# Patient Record
Sex: Male | Born: 1970 | Race: Black or African American | Hispanic: No | Smoking: Current some day smoker
Health system: Southern US, Community
[De-identification: ages and names within clinical notes are randomized; demographics above are authoritative.]

## PROBLEM LIST (undated history)

## (undated) DIAGNOSIS — G4730 Sleep apnea, unspecified: Secondary | ICD-10-CM

## (undated) DIAGNOSIS — J45909 Unspecified asthma, uncomplicated: Secondary | ICD-10-CM

## (undated) DIAGNOSIS — I10 Essential (primary) hypertension: Secondary | ICD-10-CM

## (undated) DIAGNOSIS — R972 Elevated prostate specific antigen [PSA]: Secondary | ICD-10-CM

## (undated) HISTORY — DX: Sleep apnea, unspecified: G47.30

## (undated) HISTORY — DX: Unspecified asthma, uncomplicated: J45.909

## (undated) HISTORY — DX: Elevated prostate specific antigen (PSA): R97.20

---

## 2004-06-15 ENCOUNTER — Emergency Department: Payer: Self-pay | Admitting: Emergency Medicine

## 2004-06-28 ENCOUNTER — Ambulatory Visit: Payer: Self-pay | Admitting: Physician Assistant

## 2006-08-28 ENCOUNTER — Ambulatory Visit: Payer: Self-pay | Admitting: Family Medicine

## 2007-02-14 ENCOUNTER — Ambulatory Visit: Payer: Self-pay | Admitting: Internal Medicine

## 2007-05-01 ENCOUNTER — Ambulatory Visit: Payer: Self-pay | Admitting: Radiation Oncology

## 2007-05-22 ENCOUNTER — Ambulatory Visit: Payer: Self-pay | Admitting: Radiation Oncology

## 2007-06-01 ENCOUNTER — Ambulatory Visit: Payer: Self-pay | Admitting: Radiation Oncology

## 2007-08-01 HISTORY — PX: PROSTATE BIOPSY: SHX241

## 2007-08-01 HISTORY — PX: GASTRIC BYPASS: SHX52

## 2007-08-17 ENCOUNTER — Other Ambulatory Visit: Payer: Self-pay

## 2007-08-17 ENCOUNTER — Emergency Department: Payer: Self-pay | Admitting: Emergency Medicine

## 2015-09-03 ENCOUNTER — Encounter: Payer: Self-pay | Admitting: Emergency Medicine

## 2015-09-03 ENCOUNTER — Emergency Department
Admission: EM | Admit: 2015-09-03 | Discharge: 2015-09-04 | Disposition: A | Discharge status: Home / Self Care | Payer: BLUE CROSS/BLUE SHIELD | Attending: Emergency Medicine | Admitting: Emergency Medicine

## 2015-09-03 DIAGNOSIS — R0602 Shortness of breath: Secondary | ICD-10-CM | POA: Diagnosis present

## 2015-09-03 DIAGNOSIS — R0600 Dyspnea, unspecified: Secondary | ICD-10-CM | POA: Diagnosis not present

## 2015-09-03 DIAGNOSIS — F17210 Nicotine dependence, cigarettes, uncomplicated: Secondary | ICD-10-CM | POA: Diagnosis not present

## 2015-09-03 DIAGNOSIS — I10 Essential (primary) hypertension: Secondary | ICD-10-CM | POA: Insufficient documentation

## 2015-09-03 DIAGNOSIS — Z79899 Other long term (current) drug therapy: Secondary | ICD-10-CM | POA: Insufficient documentation

## 2015-09-03 DIAGNOSIS — R600 Localized edema: Secondary | ICD-10-CM | POA: Insufficient documentation

## 2015-09-03 DIAGNOSIS — IMO0001 Reserved for inherently not codable concepts without codable children: Secondary | ICD-10-CM

## 2015-09-03 HISTORY — DX: Essential (primary) hypertension: I10

## 2015-09-03 NOTE — ED Provider Notes (Signed)
[facility] Emergency Department Provider Note  ____________________________________________  Time seen: Approximately 11:59 PM  I have reviewed the triage vital signs and the nursing notes.   HISTORY  Chief Complaint Shortness of Breath    HPI David Rangel is a 45 y.o. male who presents to the ED from home with the chief complaint of shortness of breath. Approximately 10:45 PM, patient was on a conference call for work and smoking a cigarette when he felt like he was having a hard time getting a full deep breath. Symptoms were not associated with chest pain/discomfort, diaphoresis, nausea/vomiting or palpitations. Patient notes this happened one other time several weeks ago, also while smoking a cigarette. Patient does note increased recent stressors both at work and at home. Denies recent fever, chills, cough, congestion, abdominal pain, nausea, vomiting, diarrhea. Denies recent travel or trauma. States he has chronic RLE swelling since knee surgery multiple years ago.    Past Medical History  Diagnosis Date  . Hypertension     There are no active problems to display for this patient.   Past Surgical History  Procedure Laterality Date  . Gastric bypass      Current Outpatient Rx  Name  Route  Sig  Dispense  Refill  . VIAGRA 50 MG tablet   Oral   Take 1 tablet by mouth as needed.      3     Dispense as written.   Marland Kitchen losartan (COZAAR) 50 MG tablet   Oral   Take 50 mg by mouth daily.           Allergies Cashew nut oil  No family history on file.  Social History Social History  Substance Use Topics  . Smoking status: Current Some Day Smoker  . Smokeless tobacco: None  . Alcohol Use: 1.2 oz/week    2 Cans of beer per week    Review of Systems Constitutional: No fever/chills Eyes: No visual changes. ENT: No sore throat. Cardiovascular: Denies chest pain. Respiratory: Positive for shortness of breath. Gastrointestinal: No  abdominal pain.  No nausea, no vomiting.  No diarrhea.  No constipation. Genitourinary: Negative for dysuria. Musculoskeletal: Negative for back pain. Skin: Negative for rash. Neurological: Negative for headaches, focal weakness or numbness.  10-point ROS otherwise negative.  ____________________________________________   PHYSICAL EXAM:  VITAL SIGNS: ED Triage Vitals  Enc Vitals Group     BP 09/03/15 2355 165/90 mmHg     Pulse Rate 09/03/15 2355 77     Resp 09/03/15 2355 24     Temp 09/03/15 2355 98.6 F (37 C)     Temp Source 09/03/15 2355 Oral     SpO2 09/03/15 2355 97 %     Weight 09/03/15 2355 406 lb (184.16 kg)     Height 09/03/15 2355 6\' 2"  (1.88 m)     Head Cir --      Peak Flow --      Pain Score --      Pain Loc --      Pain Edu? --      Excl. in [place]? --     Constitutional: Alert and oriented. Well appearing and in no acute distress. Eyes: Conjunctivae are normal. PERRL. EOMI. Head: Atraumatic. Nose: No congestion/rhinnorhea. Mouth/Throat: Mucous membranes are moist.  Oropharynx non-erythematous. Neck: No stridor.   Cardiovascular: Normal rate, regular rhythm. Grossly normal heart sounds.  Good peripheral circulation. Respiratory: Normal respiratory effort.  No retractions. Lungs slightly diminished bibasilarly; otherwise CTAB. Gastrointestinal: Obese.  Soft and nontender. No distention. No abdominal bruits. No CVA tenderness. Musculoskeletal: No lower extremity tenderness. 2+ nonpitting BLE edema.  No joint effusions. Neurologic:  Normal speech and language. No gross focal neurologic deficits are appreciated. No gait instability. Skin:  Skin is warm, dry and intact. No rash noted. Psychiatric: Mood and affect are normal. Speech and behavior are normal.  ____________________________________________   LABS (all labs ordered are listed, but only abnormal results are displayed)  Labs Reviewed  COMPREHENSIVE METABOLIC PANEL - Abnormal; Notable for the  following:    Calcium 8.3 (*)    All other components within normal limits  CBC WITH DIFFERENTIAL/PLATELET  TROPONIN I  BRAIN NATRIURETIC PEPTIDE   ____________________________________________  EKG  ED ECG REPORT I, SUNG,JADE J, the attending physician, personally viewed and interpreted this ECG.   Date: 09/04/2015  EKG Time: 2352  Rate: 70  Rhythm: normal EKG, normal sinus rhythm  Axis: Normal  Intervals:none  ST&T Change: Nonspecific  ____________________________________________  RADIOLOGY  Chest 2 view (viewed by me; interpreted per Dr. Marisue Humble): No acute pulmonary process. ____________________________________________   PROCEDURES  Procedure(s) performed: None  Critical Care performed: No  ____________________________________________   INITIAL IMPRESSION / ASSESSMENT AND PLAN / ED COURSE  Pertinent labs & imaging results that were available during my care of the patient were reviewed by me and considered in my medical decision making (see chart for details).  45 year old male who presents with shortness of breath while smoking cigarette on a work conference call. Symptoms have resolved by the time of my interview and exam. Labs and CXR unremarkable. Given slightly diminished breath sounds bibasilarly, will try albuterol nebulizer and reassess.  ----------------------------------------- 2:22 AM on 09/04/2015 -----------------------------------------  Patient asleep. Awakened for reexamination. Good aeration, room air saturations 98%. Will prescribe albuterol MDI prn, counseled smoking cessation for greater than 3 minutes, close follow-up with PCP next week. Strict return precautions given. Patient and spouse verbalize understanding and agree with plan of care. ____________________________________________   FINAL CLINICAL IMPRESSION(S) / ED DIAGNOSES  Final diagnoses:  Shortness of breath dyspnea      David Blanch, MD 09/04/15 [phone]

## 2015-09-03 NOTE — ED Notes (Signed)
Patient states that about an hour ago he started feeling like he was having a herd time catching his breath. Denies any chest pain.

## 2015-09-04 ENCOUNTER — Emergency Department: Payer: BLUE CROSS/BLUE SHIELD

## 2015-09-04 LAB — BRAIN NATRIURETIC PEPTIDE: B Natriuretic Peptide: 9 pg/mL (ref 0.0–100.0)

## 2015-09-04 LAB — COMPREHENSIVE METABOLIC PANEL
ALBUMIN: 3.7 g/dL (ref 3.5–5.0)
ALK PHOS: 70 U/L (ref 38–126)
ALT: 18 U/L (ref 17–63)
AST: 19 U/L (ref 15–41)
Anion gap: 5 (ref 5–15)
BILIRUBIN TOTAL: 0.5 mg/dL (ref 0.3–1.2)
BUN: 11 mg/dL (ref 6–20)
CALCIUM: 8.3 mg/dL — AB (ref 8.9–10.3)
CO2: 25 mmol/L (ref 22–32)
CREATININE: 1.13 mg/dL (ref 0.61–1.24)
Chloride: 107 mmol/L (ref 101–111)
GFR calc Af Amer: >60 mL/min (ref >60)
GLUCOSE: 93 mg/dL (ref 65–99)
Potassium: 3.9 mmol/L (ref 3.5–5.1)
Sodium: 137 mmol/L (ref 135–145)
TOTAL PROTEIN: 7.3 g/dL (ref 6.5–8.1)

## 2015-09-04 LAB — CBC WITH DIFFERENTIAL/PLATELET
BASOS ABS: 0.1 10*3/uL (ref 0–0.1)
BASOS PCT: 1 %
Eosinophils Absolute: 0.2 10*3/uL (ref 0–0.7)
Eosinophils Relative: 4 %
HCT: 42.8 % (ref 40.0–52.0)
HEMOGLOBIN: 14.3 g/dL (ref 13.0–18.0)
LYMPHS PCT: 32 %
Lymphs Abs: 2 10*3/uL (ref 1.0–3.6)
MCH: 28.9 pg (ref 26.0–34.0)
MCHC: 33.3 g/dL (ref 32.0–36.0)
MCV: 86.9 fL (ref 80.0–100.0)
MONOS PCT: 14 %
Monocytes Absolute: 0.9 10*3/uL (ref 0.2–1.0)
NEUTROS ABS: 3.1 10*3/uL (ref 1.4–6.5)
NEUTROS PCT: 49 %
Platelets: 253 10*3/uL (ref 150–440)
RBC: 4.93 MIL/uL (ref 4.40–5.90)
RDW: 14.4 % (ref 11.5–14.5)
WBC: 6.3 10*3/uL (ref 3.8–10.6)

## 2015-09-04 LAB — TROPONIN I: Troponin I: <0.03 ng/mL (ref <0.031)

## 2015-09-04 MED ORDER — ALBUTEROL SULFATE (2.5 MG/3ML) 0.083% IN NEBU
2.5000 mg | INHALATION_SOLUTION | Freq: Once | RESPIRATORY_TRACT | Status: AC
  Administered 2015-09-04: 2.5 mg via RESPIRATORY_TRACT
  Filled 2015-09-04: qty 3

## 2015-09-04 MED ORDER — ALBUTEROL SULFATE HFA 108 (90 BASE) MCG/ACT IN AERS: 2.0000 | INHALATION_SPRAY | RESPIRATORY_TRACT | Status: DC | PRN

## 2015-09-04 MED ORDER — ALBUTEROL SULFATE HFA 108 (90 BASE) MCG/ACT IN AERS: 2.0000 | INHALATION_SPRAY | Freq: Once | RESPIRATORY_TRACT | Status: DC

## 2015-09-04 NOTE — Discharge Instructions (Signed)
1. You may use albuterol inhaler 2 puffs every 4 hours as needed for shortness of breath. 2. Return to the ER for worsening symptoms, persistent vomiting, difficulty breathing or other concerns.  Shortness of Breath Shortness of breath means you have trouble breathing. It could also mean that you have a medical problem. You should get immediate medical care for shortness of breath. CAUSES   Not enough oxygen in the air such as with high altitudes or a smoke-filled room.  Certain lung diseases, infections, or problems.  Heart disease or conditions, such as angina or heart failure.  Low red blood cells (anemia).  Poor physical fitness, which can cause shortness of breath when you exercise.  Chest or back injuries or stiffness.  Being overweight.  Smoking.  Anxiety, which can make you feel like you are not getting enough air. DIAGNOSIS  Serious medical problems can often be found during your physical exam. Tests may also be done to determine why you are having shortness of breath. Tests may include:  Chest X-rays.  Lung function tests.  Blood tests.  An electrocardiogram (ECG).  An ambulatory electrocardiogram. An ambulatory ECG records your heartbeat patterns over a 24-hour period.  Exercise testing.  A transthoracic echocardiogram (TTE). During echocardiography, sound waves are used to evaluate how blood flows through your heart.  A transesophageal echocardiogram (TEE).  Imaging scans. Your health care provider may not be able to find a cause for your shortness of breath after your exam. In this case, it is important to have a follow-up exam with your health care provider as directed.  TREATMENT  Treatment for shortness of breath depends on the cause of your symptoms and can vary greatly. HOME CARE INSTRUCTIONS   Do not smoke. Smoking is a common cause of shortness of breath. If you smoke, ask for help to quit.  Avoid being around chemicals or things that may bother  your breathing, such as paint fumes and dust.  Rest as needed. Slowly resume your usual activities.  If medicines were prescribed, take them as directed for the full length of time directed. This includes oxygen and any inhaled medicines.  Keep all follow-up appointments as directed by your health care provider. SEEK MEDICAL CARE IF:   Your condition does not improve in the time expected.  You have a hard time doing your normal activities even with rest.  You have any new symptoms. SEEK IMMEDIATE MEDICAL CARE IF:   Your shortness of breath gets worse.  You feel light-headed, faint, or develop a cough not controlled with medicines.  You start coughing up blood.  You have pain with breathing.  You have chest pain or pain in your arms, shoulders, or abdomen.  You have a fever.  You are unable to walk up stairs or exercise the way you normally do. MAKE SURE YOU:  Understand these instructions.  Will watch your condition.  Will get help right away if you are not doing well or get worse.   This information is not intended to replace advice given to you by your health care provider. Make sure you discuss any questions you have with your health care provider.   Document Released: 04/11/2001 Document Revised: 07/22/2013 Document Reviewed: 10/02/2011 Elsevier Interactive Patient Education [employer].

## 2015-10-05 ENCOUNTER — Encounter: Payer: Self-pay | Admitting: Urology

## 2015-10-05 ENCOUNTER — Ambulatory Visit (INDEPENDENT_AMBULATORY_CARE_PROVIDER_SITE_OTHER): Payer: BLUE CROSS/BLUE SHIELD | Admitting: Urology

## 2015-10-05 VITALS — BP 163/91 | HR 69 | Ht 74.0 in | Wt >= 6400 oz

## 2015-10-05 DIAGNOSIS — R350 Frequency of micturition: Secondary | ICD-10-CM | POA: Diagnosis not present

## 2015-10-05 DIAGNOSIS — R972 Elevated prostate specific antigen [PSA]: Secondary | ICD-10-CM | POA: Diagnosis not present

## 2015-10-05 LAB — BLADDER SCAN AMB NON-IMAGING

## 2015-10-05 LAB — URINALYSIS, COMPLETE
BILIRUBIN UA: NEGATIVE
GLUCOSE, UA: NEGATIVE
KETONES UA: NEGATIVE
Nitrite, UA: NEGATIVE
PH UA: 7.5 (ref 5.0–7.5)
PROTEIN UA: NEGATIVE
RBC UA: NEGATIVE
SPEC GRAV UA: 1.02 (ref 1.005–1.030)

## 2015-10-05 LAB — MICROSCOPIC EXAMINATION: Bacteria, UA: NONE SEEN

## 2015-10-05 MED ORDER — METRONIDAZOLE 500 MG PO TABS: 500.0000 mg | ORAL_TABLET | Freq: Three times a day (TID) | ORAL | Status: DC

## 2015-10-05 MED ORDER — CIPROFLOXACIN HCL 500 MG PO TABS: 500.0000 mg | ORAL_TABLET | Freq: Two times a day (BID) | ORAL | Status: DC

## 2015-10-05 NOTE — Progress Notes (Signed)
10/05/2015 1:26 PM   David Rangel March 25, 1971 [id number]  Referring provider: Cletis Athens, MD [address] [place], [place] [address]  Chief Complaint  Patient presents with  . Elevated PSA    New Patient    HPI: A 46 year old African-American male presents today for further evaluation and management of an elevated PSA and voiding symptoms. The patient has a nebulous history of prostate cancer, diagnosed by Dr. Eliberto Ivory in 2008 or 2009. The patient relates that he had 1/13 cores positive for prostate cancer. He offered him surgical intervention and the patient sought a second opinion at [facility]. The patient was seen by radiation oncology and recommended that he pursue active surveillance. The biopsy information or any consult notes are unavailable for my review. However, the patient has been lost to follow-up. He tells me that his PSA is been between 3.8 and 4.3 for the most part. He recently saw his new primary care provider who checked a PSA and it was noted to be elevated at 4.5 on 09/16/15. We do not have any additional PSAs to compare. The patient has a family history of prostate cancer in that both his father and his mother's brother were diagnosed with prostate cancer at a reasonably early age. In addition to the above, the patient also states that he has recently had an encounter with a male who was then diagnosed with Trichomonas. The patient states that he has been having intermittent yellow discharge per urethra. He also has had some dysuria and urinary frequency. He has been tested for STDs several times and each time has tested negative. He has not been treated for any infection at this time.  Otherwise, the patient states that he has a strong erections without the aid of phosphodiesterase inhibitor medications.  He has minimal voiding symptoms with a IPSS of 3     PMH: Past Medical History  Diagnosis Date  . Hypertension   . Sleep apnea   . Elevated PSA     Surgical  History: Past Surgical History  Procedure Laterality Date  . Gastric bypass  2009  . Prostate biopsy  2009    ?postitive    Home Medications:    Medication List       This list is accurate as of: 10/05/15  1:26 PM.  Always use your most recent med list.               albuterol 108 (90 Base) MCG/ACT inhaler  Commonly known as:  PROVENTIL HFA;VENTOLIN HFA  Inhale 2 puffs into the lungs every 4 (four) hours as needed for wheezing or shortness of breath.     ciprofloxacin 500 MG tablet  Commonly known as:  CIPRO  Take 1 tablet (500 mg total) by mouth every 12 (twelve) hours.     losartan 100 MG tablet  Commonly known as:  COZAAR     metroNIDAZOLE 500 MG tablet  Commonly known as:  FLAGYL  Take 1 tablet (500 mg total) by mouth 3 (three) times daily.     VIAGRA 50 MG tablet  Generic drug:  sildenafil  Take 1 tablet by mouth as needed.        Allergies:  Allergies  Allergen Reactions  . Cashew Nut Oil     Family History: Family History  Problem Relation Age of Onset  . Prostate cancer Paternal Uncle   . Prostate cancer Maternal Uncle   . Bladder Cancer Neg Hx   . Kidney cancer Neg Hx  Social History:  reports that he has quit smoking. He does not have any smokeless tobacco history on file. He reports that he drinks about 1.2 oz of alcohol per week. He reports that he does not use illicit drugs.  ROS: UROLOGY Frequent Urination?: Yes Hard to postpone urination?: Yes Burning/pain with urination?: Yes Get up at night to urinate?: Yes Leakage of urine?: No Urine stream starts and stops?: No Trouble starting stream?: No Do you have to strain to urinate?: No Blood in urine?: Yes Urinary tract infection?: No Sexually transmitted disease?: No Injury to kidneys or bladder?: No Painful intercourse?: No Weak stream?: No Erection problems?: Yes Penile pain?: No  Gastrointestinal Nausea?: No Vomiting?: No Indigestion/heartburn?: No Diarrhea?:  No Constipation?: No  Constitutional Fever: No Night sweats?: No Weight loss?: No Fatigue?: No  Skin Skin rash/lesions?: No Itching?: No  Eyes Blurred vision?: No Double vision?: No  Ears/Nose/Throat Sore throat?: No Sinus problems?: No  Hematologic/Lymphatic Swollen glands?: No Easy bruising?: No  Cardiovascular Leg swelling?: No Chest pain?: No  Respiratory Cough?: No Shortness of breath?: No  Endocrine Excessive thirst?: No  Musculoskeletal Back pain?: No Joint pain?: No  Neurological Headaches?: No Dizziness?: No  Psychologic Depression?: No Anxiety?: No  Physical Exam: BP 163/91 mmHg  Pulse 69  Ht 6\' 2"  (1.88 m)  Wt 404 lb (183.253 kg)  BMI 51.85 kg/m2  Constitutional:  Alert and oriented, No acute distress. HEENT:  AT, moist mucus membranes.  Trachea midline, no masses. Cardiovascular: No clubbing, cyanosis, or edema. Respiratory: Normal respiratory effort, no increased work of breathing. GI: Abdomen is soft, nontender, nondistended, no abdominal masses GU: No CVA tenderness.  Prostate is enlarged, plus 1 in size, but smooth, symmetric, no nodules Skin: No rashes, bruises or suspicious lesions. Lymph: No cervical or inguinal adenopathy. Neurologic: Grossly intact, no focal deficits, moving all 4 extremities. Psychiatric: Normal mood and affect.  Laboratory Data: Lab Results  Component Value Date   WBC 6.3 09/04/2015   HGB 14.3 09/04/2015   HCT 42.8 09/04/2015   MCV 86.9 09/04/2015   PLT 253 09/04/2015    Lab Results  Component Value Date   CREATININE 1.13 09/04/2015    No results found for: PSA  No results found for: TESTOSTERONE  No results found for: HGBA1C  Urinalysis No results found for: COLORURINE, APPEARANCEUR, LABSPEC, PHURINE, GLUCOSEU, HGBUR, BILIRUBINUR, KETONESUR, PROTEINUR, UROBILINOGEN, NITRITE, LEUKOCYTESUR  Pertinent Imaging: None  Assessment & Plan:  The patient has an elevated PSA and a very strange  and poorly documented history of prostate cancer. He would've been 45 years old when he was first diagnosed with prostate cancer. He has been lost to follow-up with no recent PSAs.  In discussing his PSA results I discussed the pitfalls of screening as well as the etiology of elevated PSA. The patient likely had an ejaculation just prior to this most recent PSA being drawn which can skew the results. Further, the patient has voiding symptoms presumably from an untreated sexually transmitted infection.  1. Urinary frequency The plan is to treat the patient with Cipro and Flagyl to cover any urinary tract pathogens. His urine today did not demonstrate Trichomonas, however one of his partners was diagnosed with a recently. - Urinalysis, Complete - BLADDER SCAN AMB NON-IMAGING - PSA  2. Elevated PSA We will repeat the patient's PSA and follow-up with him in 3 months. If it remains elevated at that time, I would strongly recommend the patient consider a repeat biopsy.  The patient  will make sure that he maintains plenty of hydration and stains from ejaculation for 1 week prior to his PSA.   Return in about 3 months (around 01/05/2016).  Ardis Hughs, [facility] [address], [place], [place] [address] [phone]

## 2016-01-03 ENCOUNTER — Other Ambulatory Visit: Payer: BLUE CROSS/BLUE SHIELD

## 2016-01-14 ENCOUNTER — Other Ambulatory Visit: Payer: BLUE CROSS/BLUE SHIELD

## 2016-01-18 ENCOUNTER — Ambulatory Visit: Payer: BLUE CROSS/BLUE SHIELD | Admitting: Urology

## 2016-11-23 DIAGNOSIS — L293 Anogenital pruritus, unspecified: Secondary | ICD-10-CM | POA: Diagnosis not present

## 2016-11-23 DIAGNOSIS — N401 Enlarged prostate with lower urinary tract symptoms: Secondary | ICD-10-CM | POA: Diagnosis not present

## 2016-11-23 DIAGNOSIS — M5440 Lumbago with sciatica, unspecified side: Secondary | ICD-10-CM | POA: Diagnosis not present

## 2016-11-23 DIAGNOSIS — I10 Essential (primary) hypertension: Secondary | ICD-10-CM | POA: Diagnosis not present

## 2017-05-02 DIAGNOSIS — G4733 Obstructive sleep apnea (adult) (pediatric): Secondary | ICD-10-CM | POA: Diagnosis not present

## 2017-05-02 DIAGNOSIS — I10 Essential (primary) hypertension: Secondary | ICD-10-CM | POA: Diagnosis not present

## 2017-05-02 DIAGNOSIS — N401 Enlarged prostate with lower urinary tract symptoms: Secondary | ICD-10-CM | POA: Diagnosis not present

## 2017-05-30 DIAGNOSIS — G4733 Obstructive sleep apnea (adult) (pediatric): Secondary | ICD-10-CM | POA: Diagnosis not present

## 2017-07-02 DIAGNOSIS — Z9989 Dependence on other enabling machines and devices: Secondary | ICD-10-CM | POA: Diagnosis not present

## 2017-07-02 DIAGNOSIS — G4733 Obstructive sleep apnea (adult) (pediatric): Secondary | ICD-10-CM | POA: Diagnosis not present

## 2017-07-02 DIAGNOSIS — L293 Anogenital pruritus, unspecified: Secondary | ICD-10-CM | POA: Diagnosis not present

## 2017-07-31 DIAGNOSIS — C61 Malignant neoplasm of prostate: Secondary | ICD-10-CM

## 2017-07-31 HISTORY — DX: Malignant neoplasm of prostate: C61

## 2017-07-31 HISTORY — PX: PROSTATE BIOPSY: SHX241

## 2017-10-02 DIAGNOSIS — N401 Enlarged prostate with lower urinary tract symptoms: Secondary | ICD-10-CM | POA: Diagnosis not present

## 2017-10-02 DIAGNOSIS — L293 Anogenital pruritus, unspecified: Secondary | ICD-10-CM | POA: Diagnosis not present

## 2017-10-02 DIAGNOSIS — G4733 Obstructive sleep apnea (adult) (pediatric): Secondary | ICD-10-CM | POA: Diagnosis not present

## 2017-12-11 DIAGNOSIS — G4733 Obstructive sleep apnea (adult) (pediatric): Secondary | ICD-10-CM | POA: Diagnosis not present

## 2018-01-11 DIAGNOSIS — G4733 Obstructive sleep apnea (adult) (pediatric): Secondary | ICD-10-CM | POA: Diagnosis not present

## 2018-02-10 DIAGNOSIS — G4733 Obstructive sleep apnea (adult) (pediatric): Secondary | ICD-10-CM | POA: Diagnosis not present

## 2018-02-26 DIAGNOSIS — L293 Anogenital pruritus, unspecified: Secondary | ICD-10-CM | POA: Diagnosis not present

## 2018-02-26 DIAGNOSIS — N401 Enlarged prostate with lower urinary tract symptoms: Secondary | ICD-10-CM | POA: Diagnosis not present

## 2018-02-26 DIAGNOSIS — G4733 Obstructive sleep apnea (adult) (pediatric): Secondary | ICD-10-CM | POA: Diagnosis not present

## 2018-03-06 NOTE — Progress Notes (Signed)
03/08/2018 3:18 PM   Melton Alar 23-Dec-1970 [id number]  Reason for visit: Follow up low risk prostate cancer, previously lost to follow up  HPI: Mr. Koppen is a 47 year old male with history of obesity s/p gastric bypass and low risk prostate cancer diagnosed over 10 years ago. He reportedly had a single core of Gleason 3+3=6 prostate cancer on biopsy ~2008 with Dr. Yves Dill and has been previously lost to follow-up multiple times. He was last seen by Dr. Louis Meckel in our group in March 2017, and PSA was 4.5 at that time. He recommended a repeat PSA and considering prostate biopsy, however the patient did not follow up. He was also treated for a possible STD (exposure to trichomonas) at that time with Cipro and Flagyl.   He is here today from his PCP, as his PSA ~2 months ago was 7.5. He also has a strong family history of PCa in father, uncle, and grandfather. His uncle and grandfather were both treated for their cancer, but he is unsure if surgery or radiation. He denies weight loss, hematuria, or bone pain.   He reports his erectile dysfunction is well managed with tadalafil.  Finally, his chief complaint in Epic prior to this visit was "urethral itching," and an STD panel was sent prior to his arrival in clinic. He denies any urethral burning, pain, discharge, or risky sexual behavior.  ROS: Please see flowsheet from today's date for complete review of systems.  Physical Exam: There were no vitals taken for this visit.  Constitutional:  Alert and oriented, No acute distress, obese Respiratory: Normal respiratory effort, no increased work of breathing. GI: Abdomen is soft, nontender, nondistended, no abdominal masses GU: No CVA tenderness DRE: Small ~30g prostate, exam limited by patient body habitus, no masses, irregularity, or lesions Skin: No rashes, bruises or suspicious lesions. Neurologic: Grossly intact, no focal deficits, moving all 4 extremities. Psychiatric: Normal mood and  affect  Laboratory Data: Lab Results  Component Value Date   WBC 6.3 09/04/2015   HGB 14.3 09/04/2015   HCT 42.8 09/04/2015   MCV 86.9 09/04/2015   PLT 253 09/04/2015    Lab Results  Component Value Date   CREATININE 1.13 09/04/2015    No results found for: PSA  No results found for: TESTOSTERONE  No results found for: HGBA1C  Urinalysis    Component Value Date/Time   APPEARANCEUR Cloudy (A) 10/05/2015 1136   GLUCOSEU Negative 10/05/2015 1136   BILIRUBINUR Negative 10/05/2015 1136   PROTEINUR Negative 10/05/2015 1136   NITRITE Negative 10/05/2015 1136   LEUKOCYTESUR Trace (A) 10/05/2015 1136    Lab Results  Component Value Date   LABMICR See below: 10/05/2015   WBCUA 11-30 (A) 10/05/2015   RBCUA 3-10 (A) 10/05/2015   LABEPIT 0-10 10/05/2015   BACTERIA None seen 10/05/2015    Pertinent Imaging: None to review  Assessment & Plan:   In summary, Mr. Winningham is a 47 year old African-American male with a strong family history of prostate cancer, with low risk prostate cancer in a single core with Gleason score 3+3=6 diagnosed around 2008.  Since that time he has been lost to follow-up numerous times. His most recent PSA with his primary care 2 months prior was 7.5.  We had a long conversation today about his diagnosis of prostate cancer with a strong family history, and the importance of close monitoring on active surveillance.  Unfortunately he has not been biopsied since his original diagnosis in 2008, nor followed closely with PSA values.  With his rising PSA of 7.5, I recommended a repeat PSA and to schedule a prostate biopsy in the coming weeks.  We specifically discussed the risks of bleeding, infection, and blood in the urine semen and stool for up to 4 weeks after prostate biopsy.  The next steps in his management will be pending the results from his prostate biopsy, and I discussed that we would review possible options including active surveillance, surgery, or  radiation when we have those results. All questions answered, and he is in agreement with the plan moving forward.  PLAN: Repeat PSA Schedule prostate biopsy within 2-3 weeks Follow up STD panel results   [name], [place] [address] [place], [place] [address] [phone]

## 2018-03-07 ENCOUNTER — Other Ambulatory Visit: Payer: Self-pay | Admitting: Urology

## 2018-03-07 DIAGNOSIS — Z8546 Personal history of malignant neoplasm of prostate: Secondary | ICD-10-CM

## 2018-03-07 DIAGNOSIS — N342 Other urethritis: Secondary | ICD-10-CM

## 2018-03-08 ENCOUNTER — Other Ambulatory Visit: Payer: Self-pay

## 2018-03-08 ENCOUNTER — Ambulatory Visit (INDEPENDENT_AMBULATORY_CARE_PROVIDER_SITE_OTHER): Payer: BLUE CROSS/BLUE SHIELD | Admitting: Urology

## 2018-03-08 ENCOUNTER — Encounter: Payer: Self-pay | Admitting: Urology

## 2018-03-08 VITALS — BP 152/92 | HR 75 | Ht 74.0 in | Wt >= 6400 oz

## 2018-03-08 DIAGNOSIS — C61 Malignant neoplasm of prostate: Secondary | ICD-10-CM

## 2018-03-08 DIAGNOSIS — Z8546 Personal history of malignant neoplasm of prostate: Secondary | ICD-10-CM | POA: Diagnosis not present

## 2018-03-08 DIAGNOSIS — N342 Other urethritis: Secondary | ICD-10-CM

## 2018-03-08 LAB — URINALYSIS, COMPLETE
BILIRUBIN UA: NEGATIVE
Glucose, UA: NEGATIVE
Ketones, UA: NEGATIVE
Leukocytes, UA: NEGATIVE
Nitrite, UA: NEGATIVE
PH UA: 6.5 (ref 5.0–7.5)
PROTEIN UA: NEGATIVE
RBC UA: NEGATIVE
Specific Gravity, UA: 1.02 (ref 1.005–1.030)
Urobilinogen, Ur: 0.2 mg/dL (ref 0.2–1.0)

## 2018-03-08 LAB — MICROSCOPIC EXAMINATION: RBC, UA: NONE SEEN /hpf (ref 0–2)

## 2018-03-08 NOTE — Addendum Note (Signed)
Addended by: Donalee Citrin on: 03/08/2018 10:55 AM   Modules accepted: Orders

## 2018-03-08 NOTE — Patient Instructions (Signed)
Follow up in 2-3 weeks for prostate bx with [facility]

## 2018-03-09 LAB — PSA: PROSTATE SPECIFIC AG, SERUM: 7.5 ng/mL — AB (ref 0.0–4.0)

## 2018-03-09 LAB — HIV ANTIBODY (ROUTINE TESTING W REFLEX): HIV SCREEN 4TH GENERATION: NONREACTIVE

## 2018-03-10 LAB — CHLAMYDIA/GONOCOCCUS/TRICHOMONAS, NAA
CHLAMYDIA BY NAA: NEGATIVE
GONOCOCCUS BY NAA: NEGATIVE
Trich vag by NAA: NEGATIVE

## 2018-03-21 DIAGNOSIS — G4733 Obstructive sleep apnea (adult) (pediatric): Secondary | ICD-10-CM | POA: Diagnosis not present

## 2018-03-27 ENCOUNTER — Ambulatory Visit: Payer: BLUE CROSS/BLUE SHIELD | Admitting: Urology

## 2018-03-27 ENCOUNTER — Encounter: Payer: Self-pay | Admitting: Urology

## 2018-03-27 ENCOUNTER — Other Ambulatory Visit: Payer: Self-pay | Admitting: Urology

## 2018-03-27 VITALS — BP 147/83 | HR 81 | Ht 74.0 in | Wt >= 6400 oz

## 2018-03-27 DIAGNOSIS — Z8546 Personal history of malignant neoplasm of prostate: Secondary | ICD-10-CM | POA: Diagnosis not present

## 2018-03-27 DIAGNOSIS — R972 Elevated prostate specific antigen [PSA]: Secondary | ICD-10-CM | POA: Diagnosis not present

## 2018-03-27 DIAGNOSIS — C61 Malignant neoplasm of prostate: Secondary | ICD-10-CM | POA: Diagnosis not present

## 2018-03-27 MED ORDER — LEVOFLOXACIN 500 MG PO TABS
500.0000 mg | ORAL_TABLET | Freq: Once | ORAL | Status: AC
  Administered 2018-03-27: 500 mg via ORAL

## 2018-03-27 MED ORDER — GENTAMICIN SULFATE 40 MG/ML IJ SOLN
80.0000 mg | Freq: Once | INTRAMUSCULAR | Status: AC
  Administered 2018-03-27: 80 mg via INTRAMUSCULAR

## 2018-03-27 NOTE — Progress Notes (Signed)
   03/27/18  Indication: Low risk GS 3+3=6 PCa on AS diagnosed 2008, lost to follow up. PSA now 7.5  Prostate Biopsy Procedure   Informed consent was obtained, and we discussed the risks of bleeding and infection/sepsis. A time out was performed to ensure correct patient identity.  Pre-Procedure: - Last PSA Level: 7.5 - Gentamicin and levaquin given for antibiotic prophylaxis - Transrectal Ultrasound performed revealing a 29.5g prostate - No significant hypoechoic or median lobe noted, mild calcifications  Procedure: - Prostate block performed using 10 cc 1% lidocaine and biopsies taken from sextant areas, a total of 12 under ultrasound guidance.  Post-Procedure: - Patient tolerated the procedure well - He was counseled to seek immediate medical attention if experiences significant bleeding, fevers, or severe pain - Return in one week to discuss biopsy results  Assessment/ Plan: Will follow up in 1-2 weeks to discuss pathology  Nickolas Madrid, MD 03/27/2018

## 2018-04-02 ENCOUNTER — Telehealth: Payer: Self-pay

## 2018-04-02 NOTE — Telephone Encounter (Signed)
Pt calls and states that he had a prostate bx approx 1 week ago. Pt states that on Sunday he began feeling ill, this progressed into Monday at which time he began to notice fevers, chills, and aches. Pt notes that his highest tempature was 102. He has also noticed increased urinary urgency. Spoke with Dr. Diamantina Providence who performed bx, he advises that pt go to ED. Advised pt to go to the ED, pt gave verbal understanding.

## 2018-04-03 ENCOUNTER — Encounter (HOSPITAL_BASED_OUTPATIENT_CLINIC_OR_DEPARTMENT_OTHER): Payer: Self-pay | Admitting: Emergency Medicine

## 2018-04-03 ENCOUNTER — Emergency Department (HOSPITAL_BASED_OUTPATIENT_CLINIC_OR_DEPARTMENT_OTHER)
Admission: EM | Admit: 2018-04-03 | Discharge: 2018-04-03 | Disposition: A | Discharge status: Home / Self Care | Payer: BLUE CROSS/BLUE SHIELD | Source: Home / Self Care | Attending: Emergency Medicine | Admitting: Emergency Medicine

## 2018-04-03 ENCOUNTER — Other Ambulatory Visit: Payer: Self-pay | Admitting: Urology

## 2018-04-03 ENCOUNTER — Other Ambulatory Visit: Payer: Self-pay

## 2018-04-03 DIAGNOSIS — R7881 Bacteremia: Secondary | ICD-10-CM | POA: Diagnosis not present

## 2018-04-03 DIAGNOSIS — N419 Inflammatory disease of prostate, unspecified: Secondary | ICD-10-CM | POA: Diagnosis not present

## 2018-04-03 DIAGNOSIS — G4733 Obstructive sleep apnea (adult) (pediatric): Secondary | ICD-10-CM | POA: Diagnosis not present

## 2018-04-03 DIAGNOSIS — Z87891 Personal history of nicotine dependence: Secondary | ICD-10-CM | POA: Diagnosis not present

## 2018-04-03 DIAGNOSIS — N390 Urinary tract infection, site not specified: Secondary | ICD-10-CM | POA: Diagnosis not present

## 2018-04-03 DIAGNOSIS — R509 Fever, unspecified: Secondary | ICD-10-CM | POA: Insufficient documentation

## 2018-04-03 DIAGNOSIS — E669 Obesity, unspecified: Secondary | ICD-10-CM | POA: Diagnosis not present

## 2018-04-03 DIAGNOSIS — Z79899 Other long term (current) drug therapy: Secondary | ICD-10-CM

## 2018-04-03 DIAGNOSIS — B9620 Unspecified Escherichia coli [E. coli] as the cause of diseases classified elsewhere: Secondary | ICD-10-CM | POA: Diagnosis not present

## 2018-04-03 DIAGNOSIS — C61 Malignant neoplasm of prostate: Secondary | ICD-10-CM | POA: Diagnosis not present

## 2018-04-03 DIAGNOSIS — B9689 Other specified bacterial agents as the cause of diseases classified elsewhere: Secondary | ICD-10-CM | POA: Diagnosis not present

## 2018-04-03 DIAGNOSIS — I10 Essential (primary) hypertension: Secondary | ICD-10-CM | POA: Diagnosis not present

## 2018-04-03 DIAGNOSIS — N410 Acute prostatitis: Secondary | ICD-10-CM

## 2018-04-03 DIAGNOSIS — Z91018 Allergy to other foods: Secondary | ICD-10-CM | POA: Diagnosis not present

## 2018-04-03 DIAGNOSIS — Z9884 Bariatric surgery status: Secondary | ICD-10-CM | POA: Diagnosis not present

## 2018-04-03 DIAGNOSIS — Z8546 Personal history of malignant neoplasm of prostate: Secondary | ICD-10-CM | POA: Insufficient documentation

## 2018-04-03 DIAGNOSIS — Z8042 Family history of malignant neoplasm of prostate: Secondary | ICD-10-CM | POA: Diagnosis not present

## 2018-04-03 LAB — PATHOLOGY REPORT

## 2018-04-03 LAB — CBC WITH DIFFERENTIAL/PLATELET
BASOS ABS: 0 10*3/uL (ref 0.0–0.1)
BASOS PCT: 0 %
EOS ABS: 0.1 10*3/uL (ref 0.0–0.7)
EOS PCT: 1 %
HEMATOCRIT: 37.7 % — AB (ref 39.0–52.0)
Hemoglobin: 12.8 g/dL — ABNORMAL LOW (ref 13.0–17.0)
Lymphocytes Relative: 14 %
Lymphs Abs: 1.2 10*3/uL (ref 0.7–4.0)
MCH: 28.6 pg (ref 26.0–34.0)
MCHC: 34 g/dL (ref 30.0–36.0)
MCV: 84.2 fL (ref 78.0–100.0)
MONO ABS: 1.2 10*3/uL — AB (ref 0.1–1.0)
MONOS PCT: 14 %
NEUTROS ABS: 5.9 10*3/uL (ref 1.7–7.7)
Neutrophils Relative %: 71 %
PLATELETS: 260 10*3/uL (ref 150–400)
RBC: 4.48 MIL/uL (ref 4.22–5.81)
RDW: 13.7 % (ref 11.5–15.5)
WBC: 8.3 10*3/uL (ref 4.0–10.5)

## 2018-04-03 LAB — URINALYSIS, MICROSCOPIC (REFLEX)

## 2018-04-03 LAB — COMPREHENSIVE METABOLIC PANEL
ALK PHOS: 52 U/L (ref 38–126)
ALT: 16 U/L (ref 0–44)
ANION GAP: 11 (ref 5–15)
AST: 19 U/L (ref 15–41)
Albumin: 3.4 g/dL — ABNORMAL LOW (ref 3.5–5.0)
BILIRUBIN TOTAL: 0.7 mg/dL (ref 0.3–1.2)
BUN: 14 mg/dL (ref 6–20)
CO2: 26 mmol/L (ref 22–32)
CREATININE: 1.09 mg/dL (ref 0.61–1.24)
Calcium: 8.2 mg/dL — ABNORMAL LOW (ref 8.9–10.3)
Chloride: 101 mmol/L (ref 98–111)
GFR calc non Af Amer: >60 mL/min (ref >60)
Glucose, Bld: 129 mg/dL — ABNORMAL HIGH (ref 70–99)
Potassium: 3.2 mmol/L — ABNORMAL LOW (ref 3.5–5.1)
SODIUM: 138 mmol/L (ref 135–145)
TOTAL PROTEIN: 7.5 g/dL (ref 6.5–8.1)

## 2018-04-03 LAB — URINALYSIS, ROUTINE W REFLEX MICROSCOPIC
Bilirubin Urine: NEGATIVE
Glucose, UA: NEGATIVE mg/dL
Ketones, ur: 15 mg/dL — AB
Leukocytes, UA: NEGATIVE
Nitrite: POSITIVE — AB
Protein, ur: 30 mg/dL — AB
Specific Gravity, Urine: 1.025 (ref 1.005–1.030)
pH: 5.5 (ref 5.0–8.0)

## 2018-04-03 LAB — I-STAT CG4 LACTIC ACID, ED: Lactic Acid, Venous: 0.87 mmol/L (ref 0.5–1.9)

## 2018-04-03 MED ORDER — SULFAMETHOXAZOLE-TRIMETHOPRIM 800-160 MG PO TABS: 1.0000 | ORAL_TABLET | Freq: Two times a day (BID) | ORAL | 0 refills | Status: AC

## 2018-04-03 MED ORDER — ONDANSETRON HCL 4 MG/2ML IJ SOLN
4.0000 mg | Freq: Once | INTRAMUSCULAR | Status: AC
  Administered 2018-04-03: 4 mg via INTRAVENOUS
  Filled 2018-04-03: qty 2

## 2018-04-03 MED ORDER — SODIUM CHLORIDE 0.9 % IV SOLN
1.0000 g | Freq: Once | INTRAVENOUS | Status: AC
  Administered 2018-04-03: 1 g via INTRAVENOUS
  Filled 2018-04-03: qty 10

## 2018-04-03 MED ORDER — LACTATED RINGERS IV BOLUS
1000.0000 mL | Freq: Once | INTRAVENOUS | Status: AC
  Administered 2018-04-03: 1000 mL via INTRAVENOUS

## 2018-04-03 MED FILL — SULFAMETHOXAZOLE-TMP DS TAB: 800-160 | 14 days supply | Qty: 28 | Fill #0

## 2018-04-03 NOTE — ED Notes (Signed)
Patient is resting comfortably, sleeping, snoring. No distress

## 2018-04-03 NOTE — ED Notes (Signed)
States," I am still having dizziness when I turn or move" Denies pain

## 2018-04-03 NOTE — ED Provider Notes (Signed)
[facility] EMERGENCY DEPARTMENT Provider Note   CSN: [id number] Arrival date & time: 04/03/18  3086     History   Chief Complaint Chief Complaint  Patient presents with  . Fever    HPI David Rangel is a 47 y.o. male.  47 year old male with past medical history including prostate cancer, OSA, hypertension, gastric bypass who presents with fever.  1 week ago, the patient had a prostate biopsy.  He was given antibiotics prior to the procedure, no ongoing antibiotics postprocedure.  3 days ago he began not feeling well.  2 days ago he started running fevers.  T-max 103 last night.  He took Tylenol at 630 this morning before coming in.  He reports one episode of dysuria and one episode of urinary incontinence but no urinary symptoms currently.  His urine was dark but it improved after drinking water.  He denies any flank pain.  He has had an occasional left side soreness that comes and goes, not present currently.  No cough or shortness of breath.  No sick contacts. Normal bowel movements, no bloody stools. His pain has been minimal to absent since biopsy.  The history is provided by the patient.  Fever      Past Medical History:  Diagnosis Date  . Elevated PSA   . Hypertension   . Sleep apnea     Patient Active Problem List   Diagnosis Date Noted  . Prostate cancer ([place]) 03/08/2018    Past Surgical History:  Procedure Laterality Date  . GASTRIC BYPASS  2009  . PROSTATE BIOPSY  2009   ?postitive        Home Medications    Prior to Admission medications   Medication Sig Start Date End Date Taking? Authorizing Provider  amLODipine (NORVASC) 10 MG tablet Take 10 mg by mouth daily. 01/08/18  Yes [provider]  losartan-hydrochlorothiazide (HYZAAR) 100-25 MG tablet Take 1 tablet by mouth daily. 01/08/18  Yes [provider]  sulfamethoxazole-trimethoprim (BACTRIM DS,SEPTRA DS) 800-160 MG tablet Take 1 tablet by mouth 2 (two) times daily for 14  days. 04/03/18 04/17/18  Cassundra Mckeever, Wenda Overland, MD  tadalafil (ADCIRCA/CIALIS) 20 MG tablet Take 20 mg by mouth once a week. 01/22/18   [provider]    Family History Family History  Problem Relation Age of Onset  . Prostate cancer Paternal Uncle   . Prostate cancer Maternal Uncle   . Prostate cancer Father   . Prostate cancer Paternal Grandfather   . Bladder Cancer Neg Hx   . Kidney cancer Neg Hx     Social History Social History   Tobacco Use  . Smoking status: Former Research scientist (life sciences)  . Smokeless tobacco: Never Used  . Tobacco comment: quit in 09/2015  Substance Use Topics  . Alcohol use: Yes    Alcohol/week: 2.0 standard drinks    Types: 2 Cans of beer per week  . Drug use: No     Allergies   Cashew nut oil   Review of Systems Review of Systems  Constitutional: Positive for fever.   All other systems reviewed and are negative except that which was mentioned in HPI   Physical Exam Updated Vital Signs BP 123/75   Pulse 73   Temp 97.8 F (36.6 C) (Oral)   Resp (!) 23   SpO2 97%   Physical Exam  Constitutional: He is oriented to person, place, and time. He appears well-developed and well-nourished. No distress.  HENT:  Head: Normocephalic and atraumatic.  Moist mucous membranes  Eyes: Conjunctivae are normal.  Neck: Neck supple.  Cardiovascular: Normal rate, regular rhythm and normal heart sounds.  No murmur heard. Pulmonary/Chest: Effort normal and breath sounds normal.  Abdominal: Soft. Bowel sounds are normal. He exhibits no distension. There is no tenderness.  Musculoskeletal: He exhibits edema (trace BLE).  Neurological: He is alert and oriented to person, place, and time.  Fluent speech  Skin: Skin is warm and dry.  Psychiatric: He has a normal mood and affect. Judgment normal.  Nursing note and vitals reviewed.    ED Treatments / Results  Labs (all labs ordered are listed, but only abnormal results are displayed) Labs Reviewed    COMPREHENSIVE METABOLIC PANEL - Abnormal; Notable for the following components:      Result Value   Potassium 3.2 (*)    Glucose, Bld 129 (*)    Calcium 8.2 (*)    Albumin 3.4 (*)    All other components within normal limits  CBC WITH DIFFERENTIAL/PLATELET - Abnormal; Notable for the following components:   Hemoglobin 12.8 (*)    HCT 37.7 (*)    Monocytes Absolute 1.2 (*)    All other components within normal limits  URINALYSIS, ROUTINE W REFLEX MICROSCOPIC - Abnormal; Notable for the following components:   Hgb urine dipstick TRACE (*)    Ketones, ur 15 (*)    Protein, ur 30 (*)    Nitrite POSITIVE (*)    All other components within normal limits  URINALYSIS, MICROSCOPIC (REFLEX) - Abnormal; Notable for the following components:   Bacteria, UA MANY (*)    All other components within normal limits  CULTURE, BLOOD (ROUTINE X 2)  URINE CULTURE  CULTURE, BLOOD (ROUTINE X 2)  I-STAT CG4 LACTIC ACID, ED    EKG None  Radiology No results found.  Procedures Procedures (including critical care time)  Medications Ordered in ED Medications  lactated ringers bolus 1,000 mL (0 mLs Intravenous Stopped 04/03/18 1019)  ondansetron (ZOFRAN) injection 4 mg (4 mg Intravenous Given 04/03/18 0756)  cefTRIAXone (ROCEPHIN) 1 g in sodium chloride 0.9 % 100 mL IVPB ( Intravenous Stopped 04/03/18 1005)     Initial Impression / Assessment and Plan / ED Course  I have reviewed the triage vital signs and the nursing notes.  Pertinent labs that were available during my care of the patient were reviewed by me and considered in my medical decision making (see chart for details).     Non toxic on exam, normal VS. No abd tenderness. Gave IVF, zofran and obtained labs. DDx includes UTI/pyelo, prostatitis, viral syndrome.  Lactate and CBC normal. CMP reassuring w/ normal creatinine. UA with evidence of infection, culture sent. Gave ceftriaxone. Suspect prostatitis versus UTI from recent  instrumentation.  I contacted [facility] and discussed with urologist on-call.  He agreed with plan to give ceftriaxone in the ED and he recommended Septra 14-day course at discharge.  Patient has follow-up soon in the clinic to discuss his biopsy results.  I have extensively reviewed return precautions with him including worsening fevers, severe abdominal pain, vomiting, or other sudden changes in his symptoms.  He voiced understanding.  Final Clinical Impressions(s) / ED Diagnoses   Final diagnoses:  Fever, unspecified fever cause  Acute prostatitis    ED Discharge Orders         Ordered    sulfamethoxazole-trimethoprim (BACTRIM DS,SEPTRA DS) 800-160 MG tablet  2 times daily     04/03/18 1057  Jalayna Josten, Wenda Overland, MD 04/03/18 1100

## 2018-04-03 NOTE — ED Triage Notes (Signed)
Pt states he had a prostate biopsy on Wednesday a week ago  Pt states he started not feeling well on Sunday  Monday he had a fever of 102  Pt was instructed to go to the ED but pt waited until this morning  Last night his fever was 103  Pt took tylenol at 0630 this morning

## 2018-04-03 NOTE — ED Notes (Signed)
ED Provider at bedside. 

## 2018-04-03 NOTE — ED Notes (Signed)
Given po fluids 

## 2018-04-04 ENCOUNTER — Other Ambulatory Visit: Payer: Self-pay

## 2018-04-04 ENCOUNTER — Inpatient Hospital Stay (HOSPITAL_COMMUNITY)
Admission: EM | Admit: 2018-04-04 | Discharge: 2018-04-05 | DRG: 690 | Disposition: A | Discharge status: Home / Self Care | Payer: BLUE CROSS/BLUE SHIELD | Attending: Internal Medicine | Admitting: Internal Medicine

## 2018-04-04 ENCOUNTER — Telehealth (HOSPITAL_BASED_OUTPATIENT_CLINIC_OR_DEPARTMENT_OTHER): Payer: Self-pay

## 2018-04-04 ENCOUNTER — Encounter (HOSPITAL_COMMUNITY): Payer: Self-pay | Admitting: *Deleted

## 2018-04-04 DIAGNOSIS — B9689 Other specified bacterial agents as the cause of diseases classified elsewhere: Secondary | ICD-10-CM | POA: Diagnosis not present

## 2018-04-04 DIAGNOSIS — R7881 Bacteremia: Secondary | ICD-10-CM | POA: Diagnosis not present

## 2018-04-04 DIAGNOSIS — I10 Essential (primary) hypertension: Secondary | ICD-10-CM | POA: Diagnosis not present

## 2018-04-04 DIAGNOSIS — N419 Inflammatory disease of prostate, unspecified: Secondary | ICD-10-CM | POA: Diagnosis not present

## 2018-04-04 DIAGNOSIS — Z9989 Dependence on other enabling machines and devices: Secondary | ICD-10-CM

## 2018-04-04 DIAGNOSIS — N390 Urinary tract infection, site not specified: Secondary | ICD-10-CM | POA: Diagnosis not present

## 2018-04-04 DIAGNOSIS — Z87891 Personal history of nicotine dependence: Secondary | ICD-10-CM | POA: Diagnosis not present

## 2018-04-04 DIAGNOSIS — Z79899 Other long term (current) drug therapy: Secondary | ICD-10-CM

## 2018-04-04 DIAGNOSIS — B9620 Unspecified Escherichia coli [E. coli] as the cause of diseases classified elsewhere: Secondary | ICD-10-CM | POA: Diagnosis not present

## 2018-04-04 DIAGNOSIS — Z91018 Allergy to other foods: Secondary | ICD-10-CM | POA: Diagnosis not present

## 2018-04-04 DIAGNOSIS — Z9884 Bariatric surgery status: Secondary | ICD-10-CM

## 2018-04-04 DIAGNOSIS — C61 Malignant neoplasm of prostate: Secondary | ICD-10-CM | POA: Diagnosis present

## 2018-04-04 DIAGNOSIS — Z8042 Family history of malignant neoplasm of prostate: Secondary | ICD-10-CM | POA: Diagnosis not present

## 2018-04-04 DIAGNOSIS — G4733 Obstructive sleep apnea (adult) (pediatric): Secondary | ICD-10-CM | POA: Diagnosis present

## 2018-04-04 DIAGNOSIS — Z6841 Body Mass Index (BMI) 40.0 and over, adult: Secondary | ICD-10-CM

## 2018-04-04 DIAGNOSIS — E669 Obesity, unspecified: Secondary | ICD-10-CM | POA: Diagnosis present

## 2018-04-04 DIAGNOSIS — N410 Acute prostatitis: Secondary | ICD-10-CM

## 2018-04-04 DIAGNOSIS — Z8546 Personal history of malignant neoplasm of prostate: Secondary | ICD-10-CM | POA: Diagnosis not present

## 2018-04-04 LAB — CBC WITH DIFFERENTIAL/PLATELET
Basophils Absolute: 0 10*3/uL (ref 0.0–0.1)
Basophils Relative: 0 %
EOS ABS: 0.2 10*3/uL (ref 0.0–0.7)
Eosinophils Relative: 3 %
HEMATOCRIT: 40.3 % (ref 39.0–52.0)
HEMOGLOBIN: 13.7 g/dL (ref 13.0–17.0)
LYMPHS ABS: 1.8 10*3/uL (ref 0.7–4.0)
LYMPHS PCT: 27 %
MCH: 28.4 pg (ref 26.0–34.0)
MCHC: 34 g/dL (ref 30.0–36.0)
MCV: 83.6 fL (ref 78.0–100.0)
Monocytes Absolute: 1.3 10*3/uL — ABNORMAL HIGH (ref 0.1–1.0)
Monocytes Relative: 20 %
NEUTROS PCT: 50 %
Neutro Abs: 3.3 10*3/uL (ref 1.7–7.7)
Platelets: 324 10*3/uL (ref 150–400)
RBC: 4.82 MIL/uL (ref 4.22–5.81)
RDW: 13.6 % (ref 11.5–15.5)
WBC: 6.7 10*3/uL (ref 4.0–10.5)

## 2018-04-04 LAB — BASIC METABOLIC PANEL
Anion gap: 11 (ref 5–15)
BUN: 10 mg/dL (ref 6–20)
CALCIUM: 8.6 mg/dL — AB (ref 8.9–10.3)
CHLORIDE: 100 mmol/L (ref 98–111)
CO2: 26 mmol/L (ref 22–32)
CREATININE: 1.03 mg/dL (ref 0.61–1.24)
GFR calc Af Amer: >60 mL/min (ref >60)
GFR calc non Af Amer: >60 mL/min (ref >60)
GLUCOSE: 96 mg/dL (ref 70–99)
Potassium: 3.6 mmol/L (ref 3.5–5.1)
Sodium: 137 mmol/L (ref 135–145)

## 2018-04-04 LAB — BLOOD CULTURE ID PANEL (REFLEXED)
Acinetobacter baumannii: NOT DETECTED
CANDIDA KRUSEI: NOT DETECTED
CANDIDA TROPICALIS: NOT DETECTED
CARBAPENEM RESISTANCE: NOT DETECTED
Candida albicans: NOT DETECTED
Candida glabrata: NOT DETECTED
Candida parapsilosis: NOT DETECTED
Enterobacter cloacae complex: NOT DETECTED
Enterobacteriaceae species: DETECTED — AB
Enterococcus species: NOT DETECTED
Escherichia coli: NOT DETECTED
Haemophilus influenzae: NOT DETECTED
KLEBSIELLA OXYTOCA: NOT DETECTED
Klebsiella pneumoniae: NOT DETECTED
Listeria monocytogenes: NOT DETECTED
Neisseria meningitidis: NOT DETECTED
PROTEUS SPECIES: NOT DETECTED
Pseudomonas aeruginosa: NOT DETECTED
SERRATIA MARCESCENS: NOT DETECTED
STAPHYLOCOCCUS AUREUS BCID: NOT DETECTED
STAPHYLOCOCCUS SPECIES: NOT DETECTED
STREPTOCOCCUS PYOGENES: NOT DETECTED
Streptococcus agalactiae: NOT DETECTED
Streptococcus pneumoniae: NOT DETECTED
Streptococcus species: NOT DETECTED

## 2018-04-04 LAB — I-STAT CG4 LACTIC ACID, ED: Lactic Acid, Venous: 0.87 mmol/L (ref 0.5–1.9)

## 2018-04-04 MED ORDER — SODIUM CHLORIDE 0.9 % IV SOLN
1.0000 g | Freq: Once | INTRAVENOUS | Status: AC
  Administered 2018-04-04: 1 g via INTRAVENOUS
  Filled 2018-04-04: qty 10

## 2018-04-04 MED ORDER — ACETAMINOPHEN 650 MG RE SUPP: 650.0000 mg | Freq: Four times a day (QID) | RECTAL | Status: DC | PRN

## 2018-04-04 MED ORDER — ONDANSETRON HCL 4 MG PO TABS: 4.0000 mg | ORAL_TABLET | Freq: Four times a day (QID) | ORAL | Status: DC | PRN

## 2018-04-04 MED ORDER — LOSARTAN POTASSIUM-HCTZ 100-25 MG PO TABS: 1.0000 | ORAL_TABLET | Freq: Every day | ORAL | Status: DC

## 2018-04-04 MED ORDER — LOSARTAN POTASSIUM 50 MG PO TABS
100.0000 mg | ORAL_TABLET | Freq: Every day | ORAL | Status: DC
  Administered 2018-04-05: 100 mg via ORAL
  Filled 2018-04-04: qty 2

## 2018-04-04 MED ORDER — SODIUM CHLORIDE 0.9 % IV SOLN: 1.0000 g | Freq: Once | INTRAVENOUS | Status: DC

## 2018-04-04 MED ORDER — ONDANSETRON HCL 4 MG/2ML IJ SOLN: 4.0000 mg | Freq: Four times a day (QID) | INTRAMUSCULAR | Status: DC | PRN

## 2018-04-04 MED ORDER — ENOXAPARIN SODIUM 100 MG/ML ~~LOC~~ SOLN
90.0000 mg | SUBCUTANEOUS | Status: DC
  Administered 2018-04-05: 90 mg via SUBCUTANEOUS
  Filled 2018-04-04 (×2): qty 1

## 2018-04-04 MED ORDER — AMLODIPINE BESYLATE 10 MG PO TABS
10.0000 mg | ORAL_TABLET | Freq: Every day | ORAL | Status: DC
  Administered 2018-04-05: 10 mg via ORAL
  Filled 2018-04-04: qty 1

## 2018-04-04 MED ORDER — ACETAMINOPHEN 325 MG PO TABS: 650.0000 mg | ORAL_TABLET | Freq: Four times a day (QID) | ORAL | Status: DC | PRN

## 2018-04-04 MED ORDER — SODIUM CHLORIDE 0.9 % IV SOLN
2.0000 g | INTRAVENOUS | Status: DC
  Administered 2018-04-05: 2 g via INTRAVENOUS
  Filled 2018-04-04: qty 2

## 2018-04-04 MED ORDER — HYDROCHLOROTHIAZIDE 25 MG PO TABS
25.0000 mg | ORAL_TABLET | Freq: Every day | ORAL | Status: DC
  Administered 2018-04-05: 25 mg via ORAL
  Filled 2018-04-04: qty 1

## 2018-04-04 NOTE — ED Notes (Signed)
ED TO INPATIENT HANDOFF REPORT  Name/Age/Gender David Rangel 47 y.o. male  Code Status   Home/SNF/Other Home  Chief Complaint PCP sent for abnormal labs  Level of Care/Admitting Diagnosis ED Disposition    ED Disposition Condition [facility] Area: [place] [[id number]]  Level of Care: Med-Surg [16]  Diagnosis: Bacteremia [790.7.ICD-9-CM]  Admitting Physician: Bethena Roys [[id number]]  Attending Physician: Bethena Roys [phone]  Estimated length of stay: past midnight tomorrow  Certification:: I certify this patient will need inpatient services for at least 2 midnights  PT Class (Do Not Modify): Inpatient [101]  PT Acc Code (Do Not Modify): Private [1]       Medical History Past Medical History:  Diagnosis Date  . Elevated PSA   . Hypertension   . Sleep apnea     Allergies Allergies  Allergen Reactions  . Cashew Nut Oil Anaphylaxis    IV Location/Drains/Wounds Patient Lines/Drains/Airways Status   Active Line/Drains/Airways    Name:   Placement date:   Placement time:   Site:   Days:   Peripheral IV 04/04/18 Right Hand   04/04/18    1800    Hand   less than 1          Labs/Imaging Results for orders placed or performed during the hospital encounter of 04/04/18 (from the past 48 hour(s))  CBC with Differential     Status: Abnormal   Collection Time: 04/04/18  4:34 PM  Result Value Ref Range   WBC 6.7 4.0 - 10.5 K/uL   RBC 4.82 4.22 - 5.81 MIL/uL   Hemoglobin 13.7 13.0 - 17.0 g/dL   HCT 40.3 39.0 - 52.0 %   MCV 83.6 78.0 - 100.0 fL   MCH 28.4 26.0 - 34.0 pg   MCHC 34.0 30.0 - 36.0 g/dL   RDW 13.6 11.5 - 15.5 %   Platelets 324 150 - 400 K/uL   Neutrophils Relative % 50 %   Neutro Abs 3.3 1.7 - 7.7 K/uL   Lymphocytes Relative 27 %   Lymphs Abs 1.8 0.7 - 4.0 K/uL   Monocytes Relative 20 %   Monocytes Absolute 1.3 (H) 0.1 - 1.0 K/uL   Eosinophils Relative 3 %   Eosinophils Absolute 0.2 0.0 - 0.7 K/uL    Basophils Relative 0 %   Basophils Absolute 0.0 0.0 - 0.1 K/uL    Comment: Performed at [facility], [place] [address], [place], [place] [address]  Basic metabolic panel     Status: Abnormal   Collection Time: 04/04/18  4:34 PM  Result Value Ref Range   Sodium 137 135 - 145 mmol/L   Potassium 3.6 3.5 - 5.1 mmol/L   Chloride 100 98 - 111 mmol/L   CO2 26 22 - 32 mmol/L   Glucose, Bld 96 70 - 99 mg/dL   BUN 10 6 - 20 mg/dL   Creatinine, Ser 1.03 0.61 - 1.24 mg/dL   Calcium 8.6 (L) 8.9 - 10.3 mg/dL   GFR calc non Af Amer >60 >60 mL/min   GFR calc Af Amer >60 >60 mL/min    Comment: (NOTE) The eGFR has been calculated using the CKD EPI equation. This calculation has not been validated in all clinical situations. eGFR's persistently <60 mL/min signify possible Chronic Kidney Disease.    Anion gap 11 5 - 15    Comment: Performed at [facility], [place] [address], [place], [place] [address]  I-Stat [place]  Lactic Acid, ED     Status: None   Collection Time: 04/04/18  4:38 PM  Result Value Ref Range   Lactic Acid, Venous 0.87 0.5 - 1.9 mmol/L   No results found.  Pending Labs Unresulted Labs (From admission, onward)    Start     Ordered   04/04/18 1619  Blood culture (routine x 2)  BLOOD CULTURE X 2,   STAT     04/04/18 1618          Vitals/Pain Today's Vitals   04/04/18 1305 04/04/18 1306 04/04/18 1700 04/04/18 1800  BP:  (!) 153/89 111/65 124/75  Pulse:  74 70 69  Resp:  '16 16 17  ' Temp:  98.5 F (36.9 C)    TempSrc:  Oral    SpO2:  98% 100% 100%  PainSc: 0-No pain   0-No pain    Isolation Precautions No active isolations  Medications Medications  cefTRIAXone (ROCEPHIN) 1 g in sodium chloride 0.9 % 100 mL IVPB (has no administration in time range)  cefTRIAXone (ROCEPHIN) 1 g in sodium chloride 0.9 % 100 mL IVPB (1 g Intravenous New Bag/Given 04/04/18 1800)    Mobility walks

## 2018-04-04 NOTE — H&P (Signed)
History and Physical    David Rangel EVO:[id number] DOB: January 13, 1971 DOA: 04/04/2018  PCP: Cletis Athens, MD   Patient coming from: Home  Chief Complaint: fevers  HPI: David Rangel is a 47 y.o. male with medical history significant for prostate cancer, HTN, who presented with reports of fevers over the past 3 days.  Patient had prostate biopsy a week ago, was given antibiotics prior to procedure, but not after.  Patient reported an episode of dysuria and urinary incontinence.  Otherwise he has no pressure or pain in his groin region. With onset of fevers patient presented to [facility], prostatitis versus UTI from recent instrumentation was suspected, EDP talked to urologist who recommended 1 dose of Rocephin in the ED and discharged home with 2-week course of Bactrim.  Blood Cultures were obtained, with ID panel positive for Enterobacteriaceae.  Patient was called to come back to the hospital.  ED Course: Stable vitals.  WBC 6.7.  Lactic acid 0.87.  Blood cultures redrawn.  Patient given 1 g IV ceftriaxone.  Hospitalist was consulted for admission, who recommended ID consult if patient needed admission, ID on call Dr. Johnnye Sima recommended admission and that patient needs at least 3-5 days of IV antibiotics.   Review of Systems: As per HPI all other systems reviewed and negative.  Past Medical History:  Diagnosis Date  . Elevated PSA   . Hypertension   . Sleep apnea     Past Surgical History:  Procedure Laterality Date  . GASTRIC BYPASS  2009  . PROSTATE BIOPSY  2009   ?postitive     reports that he has quit smoking. He has never used smokeless tobacco. He reports that he drinks about 2.0 standard drinks of alcohol per week. He reports that he does not use drugs.  Allergies  Allergen Reactions  . Cashew Nut Oil Anaphylaxis    Family History  Problem Relation Age of Onset  . Prostate cancer Paternal Uncle   . Prostate cancer Maternal Uncle   . Prostate cancer Father   . Prostate  cancer Paternal Grandfather   . Bladder Cancer Neg Hx   . Kidney cancer Neg Hx     Prior to Admission medications   Medication Sig Start Date End Date Taking? Authorizing Provider  acetaminophen (TYLENOL) 500 MG tablet Take 500 mg by mouth every 6 (six) hours as needed for mild pain.   Yes [provider]  amLODipine (NORVASC) 10 MG tablet Take 10 mg by mouth daily. 01/08/18  Yes [provider]  losartan-hydrochlorothiazide (HYZAAR) 100-25 MG tablet Take 1 tablet by mouth daily. 01/08/18  Yes [provider]  pseudoephedrine-ibuprofen (CHILDREN'S MOTRIN COLD) 15-100 MG/5ML suspension Take 10 mLs by mouth 4 (four) times daily as needed (fever).   Yes [provider]  sulfamethoxazole-trimethoprim (BACTRIM DS,SEPTRA DS) 800-160 MG tablet Take 1 tablet by mouth 2 (two) times daily for 14 days. 04/03/18 04/17/18 Yes Little, Wenda Overland, MD  tadalafil (ADCIRCA/CIALIS) 20 MG tablet Take 20 mg by mouth once a week. 01/22/18  Yes [provider]    Physical Exam: Vitals:   04/04/18 1306 04/04/18 1700 04/04/18 1800  BP: (!) 153/89 111/65 124/75  Pulse: 74 70 69  Resp: 16 16 17   Temp: 98.5 F (36.9 C)    TempSrc: Oral    SpO2: 98% 100% 100%    Constitutional: Obese, calm, comfortable Vitals:   04/04/18 1306 04/04/18 1700 04/04/18 1800  BP: (!) 153/89 111/65 124/75  Pulse: 74 70 69  Resp: 16  16 17  Temp: 98.5 F (36.9 C)    TempSrc: Oral    SpO2: 98% 100% 100%   Eyes: PERRL, lids and conjunctivae normal ENMT: Mucous membranes are moist. Posterior pharynx clear of any exudate or lesions.  Neck: normal, supple, no masses, no thyromegaly Respiratory: clear to auscultation bilaterally, no wheezing, no crackles. Normal respiratory effort. No accessory muscle use.  Cardiovascular: Regular rate and rhythm, no murmurs / rubs / gallops. No extremity edema. 2+ pedal pulses.  Abdomen: no tenderness, no masses palpated. No hepatosplenomegaly. Bowel sounds  positive.  Musculoskeletal: no clubbing / cyanosis. No joint deformity upper and lower extremities. Good ROM, no contractures. Normal muscle tone.  Skin: no rashes, lesions, ulcers. No induration Neurologic: CN 2-12 grossly intact. Strength 5/5 in all 4.  Psychiatric: Normal judgment and insight. Alert and oriented x 3. Normal mood.   Labs on Admission: I have personally reviewed following labs and imaging studies  CBC: Recent Labs  Lab 04/03/18 0743 04/04/18 1634  WBC 8.3 6.7  NEUTROABS 5.9 3.3  HGB 12.8* 13.7  HCT 37.7* 40.3  MCV 84.2 83.6  PLT 260 220   Basic Metabolic Panel: Recent Labs  Lab 04/03/18 0743 04/04/18 1634  NA 138 137  K 3.2* 3.6  CL 101 100  CO2 26 26  GLUCOSE 129* 96  BUN 14 10  CREATININE 1.09 1.03  CALCIUM 8.2* 8.6*   Liver Function Tests: Recent Labs  Lab 04/03/18 0743  AST 19  ALT 16  ALKPHOS 52  BILITOT 0.7  PROT 7.5  ALBUMIN 3.4*   Urine analysis:    Component Value Date/Time   COLORURINE YELLOW 04/03/2018 0753   APPEARANCEUR CLEAR 04/03/2018 0753   APPEARANCEUR Clear 03/08/2018 1100   LABSPEC 1.025 04/03/2018 0753   PHURINE 5.5 04/03/2018 0753   GLUCOSEU NEGATIVE 04/03/2018 0753   HGBUR TRACE (A) 04/03/2018 0753   BILIRUBINUR NEGATIVE 04/03/2018 0753   BILIRUBINUR Negative 03/08/2018 1100   KETONESUR 15 (A) 04/03/2018 0753   PROTEINUR 30 (A) 04/03/2018 0753   NITRITE POSITIVE (A) 04/03/2018 0753   LEUKOCYTESUR NEGATIVE 04/03/2018 0753   LEUKOCYTESUR Negative 03/08/2018 1100    Radiological Exams on Admission: No results found.  EKG: Independently reviewed.   Assessment/Plan Active Problems:   Bacteremia  Bacteremia-likely E. Coli, from recent prostate biopsy 1 wk ago.  Fevers.  Stable vitals.  WBC 6.7.  Lactic acid 0.87.  Does not meet sepsis criteria. ID panel from blood cultures 04/03/18-Enterobacteriaceae, with urine cultures preliminary results growing E. Coli.  1 g ceftriaxone given in ED. ID consulted in ED  recommended 3 to 5 days of IV antibiotics. -Continue ceftriaxone at increased dose 2 g daily  -Follow-up initial 9/4 and repeat blood culture 9/5 results and sensitivities  UTI/acute prostatitis from recent instrumentation for prostate biopsy.  -Continue IV ceftriaxone -Follow-up final urine cultures  Prostrate cancer-recent prostate biopsy follows with [facility]. -Patient has an appointment next week.  Hypertension-stable -Continue home antihypertensives losartan 100 HCTZ 25, Norvasc 10  DVT prophylaxis: Lovenox Code Status: Full Family Communication: Significant order at bedside Disposition Plan: Per rounding team Consults called: None  Admission status: Inpt, med-surg   Bethena Roys MD [facility] Pager [phone] From 6PM-2AM.  Otherwise please contact night-coverage [email or web address] Password [facility]  04/04/2018, 6:31 PM

## 2018-04-04 NOTE — ED Triage Notes (Signed)
Pt was called today and informed he had positive blood cultures from blood drawn yesterday. Pt was seen yesterday and diagnosed with acute prostatitis. Pt took bactrim this morning.

## 2018-04-04 NOTE — ED Notes (Signed)
Report called to Andee Poles, RN on 5E. Transporter called to transport patient to 1518.

## 2018-04-04 NOTE — ED Provider Notes (Signed)
[facility] DEPT Provider Note   CSN: [id number] Arrival date & time: 04/04/18  1251     History   Chief Complaint Chief Complaint  Patient presents with  . Abnormal Lab    HPI David Rangel is a 47 y.o. male with a past medical history of prostate cancer, hypertension, who presents to ED after being told he had positive blood cultures drawn yesterday.  Patient was seen and evaluated at [facility] ED yesterday for fever with T-max 103.  He has felt feverish for the past 3 days prior to evaluation yesterday.  He had a prostate biopsy done 1 week ago.  He was given antibiotics prior to the procedure but was not continued postprocedure.  He had one episode of dysuria and one episode of urinary incontinence.  Provider yesterday spoke to his urologist who recommended 1 dose of Rocephin in the ED and discharging home with 2-week course of Bactrim.  Patient reports compliance with his Bactrim last night and today.  He reports overall feeling better than when he presented yesterday.  He denies any changes to bowel movements, urination, ongoing fevers, vomiting, hematuria.  Follow-up appointment with urologist is in 6 days.  HPI  Past Medical History:  Diagnosis Date  . Elevated PSA   . Hypertension   . Sleep apnea     Patient Active Problem List   Diagnosis Date Noted  . Prostate cancer ([place]) 03/08/2018    Past Surgical History:  Procedure Laterality Date  . GASTRIC BYPASS  2009  . PROSTATE BIOPSY  2009   ?postitive        Home Medications    Prior to Admission medications   Medication Sig Start Date End Date Taking? Authorizing Provider  acetaminophen (TYLENOL) 500 MG tablet Take 500 mg by mouth every 6 (six) hours as needed for mild pain.   Yes [provider]  amLODipine (NORVASC) 10 MG tablet Take 10 mg by mouth daily. 01/08/18  Yes [provider]  losartan-hydrochlorothiazide (HYZAAR) 100-25 MG tablet Take 1  tablet by mouth daily. 01/08/18  Yes [provider]  pseudoephedrine-ibuprofen (CHILDREN'S MOTRIN COLD) 15-100 MG/5ML suspension Take 10 mLs by mouth 4 (four) times daily as needed (fever).   Yes [provider]  sulfamethoxazole-trimethoprim (BACTRIM DS,SEPTRA DS) 800-160 MG tablet Take 1 tablet by mouth 2 (two) times daily for 14 days. 04/03/18 04/17/18 Yes Little, Wenda Overland, MD  tadalafil (ADCIRCA/CIALIS) 20 MG tablet Take 20 mg by mouth once a week. 01/22/18  Yes [provider]    Family History Family History  Problem Relation Age of Onset  . Prostate cancer Paternal Uncle   . Prostate cancer Maternal Uncle   . Prostate cancer Father   . Prostate cancer Paternal Grandfather   . Bladder Cancer Neg Hx   . Kidney cancer Neg Hx     Social History Social History   Tobacco Use  . Smoking status: Former Research scientist (life sciences)  . Smokeless tobacco: Never Used  . Tobacco comment: quit in 09/2015  Substance Use Topics  . Alcohol use: Yes    Alcohol/week: 2.0 standard drinks    Types: 2 Cans of beer per week  . Drug use: No     Allergies   Cashew nut oil   Review of Systems Review of Systems  Constitutional: Negative for appetite change, chills and fever.  HENT: Negative for ear pain, rhinorrhea, sneezing and sore throat.   Eyes: Negative for photophobia and visual disturbance.  Respiratory: Negative for cough, chest tightness, shortness of breath and wheezing.   Cardiovascular: Negative for chest pain and palpitations.  Gastrointestinal: Negative for abdominal pain, blood in stool, constipation, diarrhea, nausea and vomiting.  Genitourinary: Negative for dysuria, hematuria and urgency.  Musculoskeletal: Negative for myalgias.  Skin: Negative for rash.  Neurological: Negative for dizziness, weakness and light-headedness.     Physical Exam Updated Vital Signs BP 124/75 (BP Location: Right Arm)   Pulse 69   Temp 98.5 F (36.9 C) (Oral)   Resp 17   SpO2  100%   Physical Exam  Constitutional: He appears well-developed and well-nourished. No distress.  HENT:  Head: Normocephalic and atraumatic.  Nose: Nose normal.  Eyes: Conjunctivae and EOM are normal. Right eye exhibits no discharge. Left eye exhibits no discharge. No scleral icterus.  Neck: Normal range of motion. Neck supple.  Cardiovascular: Normal rate, regular rhythm, normal heart sounds and intact distal pulses. Exam reveals no gallop and no friction rub.  No murmur heard. Pulmonary/Chest: Effort normal and breath sounds normal. No respiratory distress.  Abdominal: Soft. Bowel sounds are normal. He exhibits no distension. There is no tenderness. There is no guarding.  Musculoskeletal: Normal range of motion. He exhibits no edema.  Neurological: He is alert. He exhibits normal muscle tone. Coordination normal.  Skin: Skin is warm and dry. No rash noted.  Psychiatric: He has a normal mood and affect.  Nursing note and vitals reviewed.    ED Treatments / Results  Labs (all labs ordered are listed, but only abnormal results are displayed) Labs Reviewed  CBC WITH DIFFERENTIAL/PLATELET - Abnormal; Notable for the following components:      Result Value   Monocytes Absolute 1.3 (*)    All other components within normal limits  BASIC METABOLIC PANEL - Abnormal; Notable for the following components:   Calcium 8.6 (*)    All other components within normal limits  CULTURE, BLOOD (ROUTINE X 2)  CULTURE, BLOOD (ROUTINE X 2)  I-STAT CG4 LACTIC ACID, ED    EKG None  Radiology No results found.  Procedures Procedures (including critical care time)  Medications Ordered in ED Medications  cefTRIAXone (ROCEPHIN) 1 g in sodium chloride 0.9 % 100 mL IVPB (1 g Intravenous New Bag/Given 04/04/18 1800)     Initial Impression / Assessment and Plan / ED Course  I have reviewed the triage vital signs and the nursing notes.  Pertinent labs & imaging results that were available during  my care of the patient were reviewed by me and considered in my medical decision making (see chart for details).  Clinical Course as of Apr 05 1823  Thu Apr 04, 2018  1734 Spoke to hospitalist who recommends that I speak to infectious disease for plan based on clinical picture currently.  Will consult infectious disease. Patient continues to be in NAD.   [HK]    Clinical Course User Index [HK] Delia Heady, PA-C    47 year old male with past medical history of prostate cancer, hypertension presents to ED for positive blood cultures.  Patient seen and evaluated last night at [facility] ED after being diagnosed with prostatitis.  He was given 1 g of Rocephin and discharged home with a 2-week course of Bactrim.  He reports compliance with his Bactrim.  Reports improvement in his fevers and overall symptoms.  Infectious disease panel was positive for Enterobacteriaceae A.  I spoke to pharmacist who states that Rocephin would be antibiotic of choice based on  these findings.  Spoke to Dr. Johnnye Sima of infectious disease who recommends at least 3 to 5 days of IV antibiotics due to patient's bacteremia.  Does not recommend discharge home.  Will admit to hospitalist.  Patient is agreeable to the plan.  Portions of this note were generated with Lobbyist. Dictation errors may occur despite best attempts at proofreading.   Final Clinical Impressions(s) / ED Diagnoses   Final diagnoses:  Bacteremia due to Gram-negative bacteria  Acute prostatitis    ED Discharge Orders    None       Delia Heady, PA-C 04/04/18 1825    Dorie Rank, MD 04/06/18 [phone]

## 2018-04-05 DIAGNOSIS — Z9884 Bariatric surgery status: Secondary | ICD-10-CM

## 2018-04-05 DIAGNOSIS — Z8042 Family history of malignant neoplasm of prostate: Secondary | ICD-10-CM

## 2018-04-05 DIAGNOSIS — R7881 Bacteremia: Secondary | ICD-10-CM

## 2018-04-05 DIAGNOSIS — Z91018 Allergy to other foods: Secondary | ICD-10-CM

## 2018-04-05 DIAGNOSIS — B9689 Other specified bacterial agents as the cause of diseases classified elsewhere: Secondary | ICD-10-CM

## 2018-04-05 DIAGNOSIS — N410 Acute prostatitis: Secondary | ICD-10-CM

## 2018-04-05 DIAGNOSIS — B9620 Unspecified Escherichia coli [E. coli] as the cause of diseases classified elsewhere: Secondary | ICD-10-CM | POA: Diagnosis present

## 2018-04-05 DIAGNOSIS — Z87891 Personal history of nicotine dependence: Secondary | ICD-10-CM

## 2018-04-05 DIAGNOSIS — I10 Essential (primary) hypertension: Secondary | ICD-10-CM | POA: Diagnosis present

## 2018-04-05 DIAGNOSIS — Z8546 Personal history of malignant neoplasm of prostate: Secondary | ICD-10-CM

## 2018-04-05 DIAGNOSIS — Z6841 Body Mass Index (BMI) 40.0 and over, adult: Secondary | ICD-10-CM

## 2018-04-05 DIAGNOSIS — Z9989 Dependence on other enabling machines and devices: Secondary | ICD-10-CM

## 2018-04-05 DIAGNOSIS — N390 Urinary tract infection, site not specified: Principal | ICD-10-CM

## 2018-04-05 DIAGNOSIS — G4733 Obstructive sleep apnea (adult) (pediatric): Secondary | ICD-10-CM | POA: Diagnosis present

## 2018-04-05 LAB — URINE CULTURE

## 2018-04-05 NOTE — Discharge Summary (Signed)
Physician Discharge Summary  David Rangel YQM:[id number] DOB: [birth year] DOA: 04/04/2018  PCP: Cletis Athens, MD  Admit date: 04/04/2018 Discharge date: 04/05/2018  Time spent: 60 minutes  Recommendations for Outpatient Follow-up:  1. Follow-up with Cletis Athens, MD in 2 weeks.  On follow-up final bacterial cultures and sensitivities will need to be followed up upon. 2. Follow-up with urologist, [facility] as scheduled.  On follow-up blood cultures and urine cultures will need to be followed up upon.   Discharge Diagnoses:  Principal Problem:   Bacteremia Active Problems:   E. coli UTI   HTN (hypertension)   Obesity   OSA (obstructive sleep apnea)   Status post gastric bypass for obesity   Discharge Condition: Stable  Diet recommendation: Heart healthy  Filed Weights   04/05/18 0812  Weight: (!) 187.7 kg    History of present illness:  Per Dr Leafy Half is a 47 y.o. male with medical history significant for prostate cancer, HTN, who presented with reports of fevers over the past 3 days.  Patient had prostate biopsy a week ago, was given antibiotics prior to procedure, but not after.  Patient reported an episode of dysuria and urinary incontinence.  Otherwise he has no pressure or pain in his groin region. With onset of fevers patient presented to [facility], prostatitis versus UTI from recent instrumentation was suspected, EDP talked to urologist who recommended 1 dose of Rocephin in the ED and discharged home with 2-week course of Bactrim.  Blood Cultures were obtained, with ID panel positive for Enterobacteriaceae.  Patient was called to come back to the hospital.  ED Course: Stable vitals.  WBC 6.7.  Lactic acid 0.87.  Blood cultures redrawn.  Patient given 1 g IV ceftriaxone.  Hospitalist was consulted for admission, who recommended ID consult if patient needed admission, ID on call Dr. Johnnye Sima recommended admission and that patient needs at least  3-5 days of IV antibiotics.    Hospital Course:  #1 Panteo agglomerans(formerly Enterobacter) bacteremia/E. coli UTI/?  Prostatitis Patient had presented 1 day prior to admission to the ED with fevers x3 days after prostate biopsy was done.  Patient also noted to have some dysuria and urinary incontinence.  Urinalysis which was done was concerning for UTI versus prostatitis due to recent instrumentation patient given a dose of IV Rocephin in the ED and discharged home on a 2-week course of Bactrim.  Patient called back to the ED due to concerns for bacteremia and admitted.  Patient placed empirically on IV Rocephin.  Patient improved clinically with prompt improvement once antibiotics were started.  Patient remained afebrile.  Patient with normal white count which remained normal during the hospitalization.  ID consultation was obtained and patient seen in consultation by Dr. Megan Salon.  Dr. Megan Salon reviewed the cultures and due to patient's rapid improvement it was felt patient was okay for discharge home on a 14-day course of trimethoprim sulfamethoxazole which patient has prescription for.  Patient will be discharged home in stable and improved condition and is to follow-up with his urologist as scheduled in 5 days as well as PCP.  2.  Prostate cancer Patient with recent prostate biopsy follows with [facility].  Patient with appointment next week outpatient follow-up.  3.  Hypertension Stable.  Patient maintained on home regimen of Norvasc, losartan, HCTZ.  Procedures:  None  Consultations:  ID:Dr. Megan Salon 04/05/2018  Discharge Exam: Vitals:   04/05/18 0812 04/05/18 1332  BP: 119/71 (!) 141/88  Pulse: 65 70  Resp: 16 20  Temp: 98 F (36.7 C) 98.4 F (36.9 C)  SpO2:  97%    General: NAD Cardiovascular: RRR Respiratory: CTAB  Discharge Instructions   Discharge Instructions    Diet - low sodium heart healthy   Complete by:  As directed    Increase  activity slowly   Complete by:  As directed      Allergies as of 04/05/2018      Reactions   Cashew Nut Oil Anaphylaxis      Medication List    TAKE these medications   acetaminophen 500 MG tablet Commonly known as:  TYLENOL Take 500 mg by mouth every 6 (six) hours as needed for mild pain.   amLODipine 10 MG tablet Commonly known as:  NORVASC Take 10 mg by mouth daily.   losartan-hydrochlorothiazide 100-25 MG tablet Commonly known as:  HYZAAR Take 1 tablet by mouth daily.   pseudoephedrine-ibuprofen 15-100 MG/5ML suspension Commonly known as:  CHILDREN'S MOTRIN COLD Take 10 mLs by mouth 4 (four) times daily as needed (fever).   sulfamethoxazole-trimethoprim 800-160 MG tablet Commonly known as:  BACTRIM DS,SEPTRA DS Take 1 tablet by mouth 2 (two) times daily for 14 days.   tadalafil 20 MG tablet Commonly known as:  ADCIRCA/CIALIS Take 20 mg by mouth once a week.      Allergies  Allergen Reactions  . Cashew Nut Oil Anaphylaxis   Follow-up Information    Cletis Athens, MD. Schedule an appointment as soon as possible for a visit in 2 week(s).   Specialty:  Internal Medicine Contact information: [place] [address] [phone]        urologist Follow up.   Why:  f/u as scheduled.           The results of significant diagnostics from this hospitalization (including imaging, microbiology, ancillary and laboratory) are listed below for reference.    Significant Diagnostic Studies: No results found.  Microbiology: Recent Results (from the past 240 hour(s))  Urine culture     Status: Abnormal   Collection Time: 04/03/18  7:43 AM  Result Value Ref Range Status   Specimen Description   Final    URINE, CLEAN CATCH Performed at [facility], [address] [place], [place] [address]    Special Requests   Final    NONE Performed at [facility], [address] [place], [place] [address]    Culture >=100,000  COLONIES/mL ESCHERICHIA COLI (A)  Final   Report Status 04/05/2018 FINAL  Final   Organism ID, Bacteria ESCHERICHIA COLI (A)  Final      Susceptibility   Escherichia coli - MIC*    AMPICILLIN >=32 RESISTANT Resistant     CEFAZOLIN <=4 SENSITIVE Sensitive     CEFTRIAXONE <=1 SENSITIVE Sensitive     CIPROFLOXACIN 0.5 SENSITIVE Sensitive     GENTAMICIN <=1 SENSITIVE Sensitive     IMIPENEM <=0.25 SENSITIVE Sensitive     NITROFURANTOIN <=16 SENSITIVE Sensitive     TRIMETH/SULFA <=20 SENSITIVE Sensitive     AMPICILLIN/SULBACTAM 16 INTERMEDIATE Intermediate     PIP/TAZO <=4 SENSITIVE Sensitive     Extended ESBL NEGATIVE Sensitive     * >=100,000 COLONIES/mL ESCHERICHIA COLI  Culture, blood (routine x 2)     Status: Abnormal (Preliminary result)   Collection Time: 04/03/18  7:45 AM  Result Value Ref Range Status   Specimen Description   Final    BLOOD RIGHT HAND Performed at [facility], [address] [place], [place] [address]    Special Requests   Final    BOTTLES DRAWN AEROBIC AND ANAEROBIC Blood Culture adequate volume Performed at [facility], [address] [place], [place] [address]    Culture  Setup Time   Final    GRAM NEGATIVE RODS AEROBIC BOTTLE ONLY CRITICAL RESULT CALLED TO, READ BACK BY AND VERIFIED WITH: Casandra Doffing RN [id number] 04/04/18 A BROWNING    Culture (A)  Final    PANTOEA AGGLOMERANS SUSCEPTIBILITIES TO FOLLOW Performed at [facility], [place] [address], [place], [place] [address]    Report Status PENDING  Incomplete  Culture, blood (routine x 2)     Status: None (Preliminary result)   Collection Time: 04/03/18  7:45 AM  Result Value Ref Range Status   Specimen Description   Final    BLOOD LEFT ANTECUBITAL Performed at [facility], [place] [address], [place], [place] [address]    Special Requests   Final    BOTTLES DRAWN AEROBIC AND ANAEROBIC Blood Culture adequate volume Performed at [facility], [address]  [place], [place] [address]    Culture   Final    NO GROWTH 2 DAYS Performed at [facility], [place] [address], [place], [place] [address]    Report Status PENDING  Incomplete  Blood Culture ID Panel (Reflexed)     Status: Abnormal   Collection Time: 04/03/18  7:45 AM  Result Value Ref Range Status   Enterococcus species NOT DETECTED NOT DETECTED Final   Listeria monocytogenes NOT DETECTED NOT DETECTED Final   Staphylococcus species NOT DETECTED NOT DETECTED Final   Staphylococcus aureus NOT DETECTED NOT DETECTED Final   Streptococcus species NOT DETECTED NOT DETECTED Final   Streptococcus agalactiae NOT DETECTED NOT DETECTED Final   Streptococcus pneumoniae NOT DETECTED NOT DETECTED Final   Streptococcus pyogenes NOT DETECTED NOT DETECTED Final   Acinetobacter baumannii NOT DETECTED NOT DETECTED Final   Enterobacteriaceae species DETECTED (A) NOT DETECTED Final    Comment: Enterobacteriaceae represent a large family of gram negative bacteria, not a single organism. Refer to culture for further identification. CRITICAL RESULT CALLED TO, READ BACK BY AND VERIFIED WITH: K NEAL RN [id number] 04/04/18 A BROWNING    Enterobacter cloacae complex NOT DETECTED NOT DETECTED Final   Escherichia coli NOT DETECTED NOT DETECTED Final   Klebsiella oxytoca NOT DETECTED NOT DETECTED Final   Klebsiella pneumoniae NOT DETECTED NOT DETECTED Final   Proteus species NOT DETECTED NOT DETECTED Final   Serratia marcescens NOT DETECTED NOT DETECTED Final   Carbapenem resistance NOT DETECTED NOT DETECTED Final   Haemophilus influenzae NOT DETECTED NOT DETECTED Final   Neisseria meningitidis NOT DETECTED NOT DETECTED Final   Pseudomonas aeruginosa NOT DETECTED NOT DETECTED Final   Candida albicans NOT DETECTED NOT DETECTED Final   Candida glabrata NOT DETECTED NOT DETECTED Final   Candida krusei NOT DETECTED NOT DETECTED Final   Candida parapsilosis NOT DETECTED NOT DETECTED Final   Candida tropicalis NOT DETECTED  NOT DETECTED Final    Comment: Performed at [facility], [place]. [address] [place], [place] [address]  Blood culture (routine x 2)     Status: None (Preliminary result)   Collection Time: 04/04/18  5:37 PM  Result Value Ref Range Status   Specimen Description   Final    BLOOD LEFT HAND RING FINGER Performed at [facility], [place] [address] [place], [place] [address]    Special Requests   Final    BOTTLES DRAWN AEROBIC AND ANAEROBIC Blood Culture results may not be optimal due to an excessive volume of blood received in culture bottles Performed at [place] [address] [place], [place] [address]    Culture   Final    NO GROWTH < 24 HOURS Performed at [place] [address] [place], [place] [address]    Report Status PENDING  Incomplete  Blood culture (routine x 2)     Status: None (Preliminary result)   Collection Time: 04/04/18  5:37 PM  Result Value Ref Range Status   Specimen Description   Final    BLOOD LEFT HAND INDEX FINGER Performed at [facility] [address] [place], [place] [address]    Special Requests   Final    BOTTLES DRAWN AEROBIC AND ANAEROBIC Blood Culture results may not be optimal due to an excessive volume of blood received in culture bottles Performed at [facility] [address] [place], [place] [address]    Culture   Final    NO GROWTH < 24 HOURS Performed at [facility] [address], [place], [place] [address]    Report Status PENDING  Incomplete     Labs: Basic Metabolic Panel: Recent Labs  Lab 04/03/18 0743 04/04/18 1634  NA 138 137  K 3.2* 3.6  CL 101 100  CO2 26 26  GLUCOSE 129* 96  BUN 14 10  CREATININE 1.09 1.03  CALCIUM 8.2* 8.6*   Liver Function Tests: Recent Labs  Lab 04/03/18 0743  AST 19  ALT 16  ALKPHOS 52  BILITOT 0.7  PROT 7.5  ALBUMIN 3.4*   No results for input(s): LIPASE, AMYLASE in the last 168  hours. No results for input(s): AMMONIA in the last 168 hours. CBC: Recent Labs  Lab 04/03/18 0743 04/04/18 1634  WBC 8.3 6.7  NEUTROABS 5.9 3.3  HGB 12.8* 13.7  HCT 37.7* 40.3  MCV 84.2 83.6  PLT 260 324   Cardiac Enzymes: No results for input(s): CKTOTAL, CKMB, CKMBINDEX, TROPONINI in the last 168 hours. BNP: BNP (last 3 results) No results for input(s): BNP in the last 8760 hours.  ProBNP (last 3 results) No results for input(s): PROBNP in the last 8760 hours.  CBG: No results for input(s): GLUCAP in the last 168 hours.     Signed:  Irine Seal MD.  [facility] 04/05/2018, 5:08 PM

## 2018-04-05 NOTE — Progress Notes (Signed)
Patient has discharged to home on 04/05/18. Discharge instruction including medication and appointments was given to patient. Notified CM related to potential condition code 46 and got a reply that it was not applied. Patient has no question at this time.

## 2018-04-05 NOTE — Progress Notes (Signed)
Spoke with Cabin crew. At this time patient dc plan is not confirmed. Instructed by Kerry Dory to wait until latter d/t possible DC today. Instructed to notify [facility] if patient is not DC'd and vascular access is needed. Notified patient of POC. VU. Fran Lowes, RN [facility]

## 2018-04-05 NOTE — Progress Notes (Addendum)
Patient ID: David Rangel, male   DOB: Apr 05, 1971, 47 y.o.   MRN: [id number]         [facility]    Date of Admission:  04/04/2018    Total days of antibiotics 3        Day 1 ceftriaxone               Reason for Consult: Bacteremia E. coli UTI following prostate biopsy    Referring Provider: Dr. Irine Seal  Assessment: He developed a urinary tract infection following recent prostate biopsy.  He had prompt improvement once antibiotics were started.  Interestingly his urine culture has grown E. coli sensitive to ceftriaxone and trimethoprim sulfamethoxazole but his blood cultures have grown Panteo agglomerans (formerly Enterobacter).  Antibiotic susceptibilities will not be available until tomorrow.  However, he is doing so well clinically I would be in agreement with discharging him home today to restart trimethoprim sulfamethoxazole for a 14-day total course.  I can check the susceptibilities tomorrow and make any adjustments needed if he is discharged.  Plan: 1. I am okay with discharge today on trimethoprim sulfamethoxazole 2. I will monitor final culture results and make any necessary adjustments in antibiotics  Active Problems:   Bacteremia   E. coli UTI   HTN (hypertension)   Obesity   OSA (obstructive sleep apnea)   Status post gastric bypass for obesity   Scheduled Meds: . amLODipine  10 mg Oral Daily  . enoxaparin (LOVENOX) injection  90 mg Subcutaneous Q24H  . losartan  100 mg Oral Daily   And  . hydrochlorothiazide  25 mg Oral Daily   Continuous Infusions: . cefTRIAXone (ROCEPHIN)  IV 2 g (04/05/18 0943)   PRN Meds:.acetaminophen **OR** acetaminophen, ondansetron **OR** ondansetron (ZOFRAN) IV  HPI: David Rangel is a 47 y.o. male with a history of prostate cancer who underwent a prostate biopsy (only 1 of 12 core biopsies were positive for adenocarcinoma) by Dr. Nickolas Madrid on 03/27/2018.  He received gentamicin and levofloxacin  prior to the biopsy.  He developed fever and dysuria on 04/02/2018 and was seen at [facility] the following day.  His temperature there was 97.8 degrees.  Urinalysis showed pyuria.  He was given 1 dose of ceftriaxone and discharged on trimethoprim sulfamethoxazole which he started taking that evening.  He felt much better.  His fever and dysuria resolved promptly. He was called and sent here for admission yesterday when 1 of 2 blood cultures turn positive for Enterobacteriaceae (BCID could not speciate organism).  His urine culture grew E. coli sensitive to trimethoprim sulfamethoxazole.  Final identification and susceptibilities on the blood culture isolate should be available this afternoon.  He has a follow-up appointment with Dr. Diamantina Providence on 04/09/2018.   Review of Systems: Review of Systems  Constitutional: Negative for chills, diaphoresis and fever.  Gastrointestinal: Negative for abdominal pain, diarrhea, nausea and vomiting.  Genitourinary: Negative for dysuria, frequency, hematuria and urgency.  Musculoskeletal: Negative for back pain.    Past Medical History:  Diagnosis Date  . Elevated PSA   . Hypertension   . Sleep apnea     Social History   Tobacco Use  . Smoking status: Former Research scientist (life sciences)  . Smokeless tobacco: Never Used  . Tobacco comment: quit in 09/2015  Substance Use Topics  . Alcohol use: Yes    Alcohol/week: 2.0 standard drinks    Types: 2 Cans of beer per week  . Drug use: No  Family History  Problem Relation Age of Onset  . Prostate cancer Paternal Uncle   . Prostate cancer Maternal Uncle   . Prostate cancer Father   . Prostate cancer Paternal Grandfather   . Bladder Cancer Neg Hx   . Kidney cancer Neg Hx    Allergies  Allergen Reactions  . Cashew Nut Oil Anaphylaxis    OBJECTIVE: Blood pressure 119/71, pulse 65, temperature 98 F (36.7 C), temperature source Oral, resp. rate 16, height 6' 2.02" (1.88 m), weight (!) 187.7 kg, SpO2 93  %.  Physical Exam  Constitutional: He is oriented to person, place, and time.  He appears comfortable and in no distress.  He is sitting up in a chair.  Cardiovascular: Normal rate, regular rhythm and normal heart sounds.  No murmur heard. Pulmonary/Chest: Effort normal and breath sounds normal. He has no wheezes. He has no rales.  Abdominal: Soft. He exhibits no distension. There is no tenderness.  No CVA or suprapubic tenderness.  Neurological: He is alert and oriented to person, place, and time.  Skin: No rash noted.  Psychiatric: He has a normal mood and affect.    Lab Results Lab Results  Component Value Date   WBC 6.7 04/04/2018   HGB 13.7 04/04/2018   HCT 40.3 04/04/2018   MCV 83.6 04/04/2018   PLT 324 04/04/2018    Lab Results  Component Value Date   CREATININE 1.03 04/04/2018   BUN 10 04/04/2018   NA 137 04/04/2018   K 3.6 04/04/2018   CL 100 04/04/2018   CO2 26 04/04/2018    Lab Results  Component Value Date   ALT 16 04/03/2018   AST 19 04/03/2018   ALKPHOS 52 04/03/2018   BILITOT 0.7 04/03/2018     Microbiology: Recent Results (from the past 240 hour(s))  Urine culture     Status: Abnormal   Collection Time: 04/03/18  7:43 AM  Result Value Ref Range Status   Specimen Description   Final    URINE, CLEAN CATCH Performed at [facility], [address] [place], [place] [address]    Special Requests   Final    NONE Performed at [facility], [address] [place], [place] [address]    Culture >=100,000 COLONIES/mL ESCHERICHIA COLI (A)  Final   Report Status 04/05/2018 FINAL  Final   Organism ID, Bacteria ESCHERICHIA COLI (A)  Final      Susceptibility   Escherichia coli - MIC*    AMPICILLIN >=32 RESISTANT Resistant     CEFAZOLIN <=4 SENSITIVE Sensitive     CEFTRIAXONE <=1 SENSITIVE Sensitive     CIPROFLOXACIN 0.5 SENSITIVE Sensitive     GENTAMICIN <=1 SENSITIVE Sensitive     IMIPENEM <=0.25 SENSITIVE Sensitive      NITROFURANTOIN <=16 SENSITIVE Sensitive     TRIMETH/SULFA <=20 SENSITIVE Sensitive     AMPICILLIN/SULBACTAM 16 INTERMEDIATE Intermediate     PIP/TAZO <=4 SENSITIVE Sensitive     Extended ESBL NEGATIVE Sensitive     * >=100,000 COLONIES/mL ESCHERICHIA COLI  Culture, blood (routine x 2)     Status: None (Preliminary result)   Collection Time: 04/03/18  7:45 AM  Result Value Ref Range Status   Specimen Description   Final    BLOOD RIGHT HAND Performed at [facility], [address] [place], [place] [address]    Special Requests   Final    BOTTLES DRAWN AEROBIC AND ANAEROBIC Blood Culture adequate volume  Performed at [facility], [address] [place], [place] [address]    Culture  Setup Time   Final    GRAM NEGATIVE RODS AEROBIC BOTTLE ONLY CRITICAL RESULT CALLED TO, READ BACK BY AND VERIFIED WITH: Casandra Doffing RN [id number] 04/04/18 A BROWNING Performed at [facility], [place] [address], [place], [place] [address]    Culture GRAM NEGATIVE RODS  Final   Report Status PENDING  Incomplete  Culture, blood (routine x 2)     Status: None (Preliminary result)   Collection Time: 04/03/18  7:45 AM  Result Value Ref Range Status   Specimen Description   Final    BLOOD LEFT ANTECUBITAL Performed at [facility], [place] [address] [place], [place] [address]    Special Requests   Final    BOTTLES DRAWN AEROBIC AND ANAEROBIC Blood Culture adequate volume Performed at [facility], [address] [place], [place] [address]    Culture   Final    NO GROWTH 1 DAY Performed at [facility], [place] [address], [place], [place] [address]    Report Status PENDING  Incomplete  Blood Culture ID Panel (Reflexed)     Status: Abnormal   Collection Time: 04/03/18  7:45 AM  Result Value Ref Range Status   Enterococcus species NOT DETECTED NOT DETECTED Final   Listeria monocytogenes NOT DETECTED NOT DETECTED Final   Staphylococcus species NOT DETECTED NOT DETECTED  Final   Staphylococcus aureus NOT DETECTED NOT DETECTED Final   Streptococcus species NOT DETECTED NOT DETECTED Final   Streptococcus agalactiae NOT DETECTED NOT DETECTED Final   Streptococcus pneumoniae NOT DETECTED NOT DETECTED Final   Streptococcus pyogenes NOT DETECTED NOT DETECTED Final   Acinetobacter baumannii NOT DETECTED NOT DETECTED Final   Enterobacteriaceae species DETECTED (A) NOT DETECTED Final    Comment: Enterobacteriaceae represent a large family of gram negative bacteria, not a single organism. Refer to culture for further identification. CRITICAL RESULT CALLED TO, READ BACK BY AND VERIFIED WITH: K NEAL RN [id number] 04/04/18 A BROWNING    Enterobacter cloacae complex NOT DETECTED NOT DETECTED Final   Escherichia coli NOT DETECTED NOT DETECTED Final   Klebsiella oxytoca NOT DETECTED NOT DETECTED Final   Klebsiella pneumoniae NOT DETECTED NOT DETECTED Final   Proteus species NOT DETECTED NOT DETECTED Final   Serratia marcescens NOT DETECTED NOT DETECTED Final   Carbapenem resistance NOT DETECTED NOT DETECTED Final   Haemophilus influenzae NOT DETECTED NOT DETECTED Final   Neisseria meningitidis NOT DETECTED NOT DETECTED Final   Pseudomonas aeruginosa NOT DETECTED NOT DETECTED Final   Candida albicans NOT DETECTED NOT DETECTED Final   Candida glabrata NOT DETECTED NOT DETECTED Final   Candida krusei NOT DETECTED NOT DETECTED Final   Candida parapsilosis NOT DETECTED NOT DETECTED Final   Candida tropicalis NOT DETECTED NOT DETECTED Final    Comment: Performed at [facility], [place]. [address], [place], [place] [address]    Michel Bickers, [facility] [phone] pager   [phone] cell 04/05/2018, 11:14 AM

## 2018-04-06 ENCOUNTER — Telehealth: Payer: Self-pay

## 2018-04-06 LAB — CULTURE, BLOOD (ROUTINE X 2): SPECIAL REQUESTS: ADEQUATE

## 2018-04-06 NOTE — Telephone Encounter (Signed)
Post ED Visit - Positive Culture Follow-up  Culture report reviewed by antimicrobial stewardship pharmacist:  []  Elenor Quinones, Pharm.D. []  Heide Guile, Pharm.D., BCPS AQ-ID []  Parks Neptune, Pharm.D., BCPS []  Alycia Rossetti, Pharm.D., BCPS []  [place], Pharm.D., BCPS, AAHIVP []  Legrand Como, Pharm.D., BCPS, AAHIVP []  Salome Arnt, PharmD, BCPS []  Johnnette Gourd, PharmD, BCPS [x]  Hughes Better, PharmD, BCPS []  Leeroy Cha, PharmD  Positive urine culture Treated with Sulfamethoxazole-Trimethoprim, organism sensitive to the same and no further patient follow-up is required at this time.  Genia Del 04/06/2018, 9:43 AM

## 2018-04-07 ENCOUNTER — Telehealth: Payer: Self-pay

## 2018-04-08 LAB — CULTURE, BLOOD (ROUTINE X 2)
CULTURE: NO GROWTH
Special Requests: ADEQUATE

## 2018-04-09 ENCOUNTER — Ambulatory Visit (INDEPENDENT_AMBULATORY_CARE_PROVIDER_SITE_OTHER): Payer: BLUE CROSS/BLUE SHIELD | Admitting: Urology

## 2018-04-09 ENCOUNTER — Encounter: Payer: Self-pay | Admitting: Urology

## 2018-04-09 VITALS — BP 119/71 | HR 80 | Ht 74.0 in | Wt >= 6400 oz

## 2018-04-09 DIAGNOSIS — C61 Malignant neoplasm of prostate: Secondary | ICD-10-CM

## 2018-04-09 LAB — CULTURE, BLOOD (ROUTINE X 2)
CULTURE: NO GROWTH
Culture: NO GROWTH

## 2018-04-09 NOTE — Progress Notes (Signed)
UROLOGY PROGRESS NOTE  CC: Follow up prostate bx results  I saw David Rangel in urology clinic today to discuss his prostate biopsy results.  Briefly, he is a 47 year old obese African-American male with a strong family history of prostate cancer who was diagnosed with a single core of Gleason score 3+3 = 6 prostate cancer in 2008.  He was then lost to follow-up.  His PSA had risen to 7.5 recently and he underwent re-biopsy on 03/27/2018.  This showed a single core of Gleason score 3+3 equal 6 disease with <5% for involvement.  He is in the very low risk category.  Unfortunately, he developed fever to 102 after his recent prostate biopsy.  He was hospitalized and treated with IV antibiotics, and discharged on 2 weeks of culture appropriate Bactrim for E. coli UTI.  He is doing much better and denies complaint today.  We had a long discussion about his very low risk prostate cancer and the need for active surveillance moving forward.    We will see him back in 1 year with a repeat PSA, continue active surveillance  Greater than 25 minutes were spent in direct patient education and counseling regarding diagnosis of very low risk prostate cancer, and complications from post-prostate biopsy infection.  [name], MD 04/09/2018

## 2018-04-21 DIAGNOSIS — G4733 Obstructive sleep apnea (adult) (pediatric): Secondary | ICD-10-CM | POA: Diagnosis not present

## 2018-04-23 ENCOUNTER — Encounter (HOSPITAL_COMMUNITY): Payer: Self-pay | Admitting: Emergency Medicine

## 2018-04-23 ENCOUNTER — Emergency Department (HOSPITAL_COMMUNITY): Payer: BLUE CROSS/BLUE SHIELD

## 2018-04-23 ENCOUNTER — Inpatient Hospital Stay (HOSPITAL_COMMUNITY)
Admission: EM | Admit: 2018-04-23 | Discharge: 2018-04-25 | DRG: 872 | Disposition: A | Discharge status: Home / Self Care | Payer: BLUE CROSS/BLUE SHIELD | Attending: Internal Medicine | Admitting: Internal Medicine

## 2018-04-23 ENCOUNTER — Other Ambulatory Visit: Payer: Self-pay

## 2018-04-23 DIAGNOSIS — R402144 Coma scale, eyes open, spontaneous, 24 hours or more after hospital admission: Secondary | ICD-10-CM | POA: Diagnosis not present

## 2018-04-23 DIAGNOSIS — Z91018 Allergy to other foods: Secondary | ICD-10-CM

## 2018-04-23 DIAGNOSIS — N390 Urinary tract infection, site not specified: Secondary | ICD-10-CM | POA: Diagnosis not present

## 2018-04-23 DIAGNOSIS — I10 Essential (primary) hypertension: Secondary | ICD-10-CM | POA: Diagnosis present

## 2018-04-23 DIAGNOSIS — Z9884 Bariatric surgery status: Secondary | ICD-10-CM

## 2018-04-23 DIAGNOSIS — C61 Malignant neoplasm of prostate: Secondary | ICD-10-CM | POA: Diagnosis present

## 2018-04-23 DIAGNOSIS — Z79899 Other long term (current) drug therapy: Secondary | ICD-10-CM

## 2018-04-23 DIAGNOSIS — G4733 Obstructive sleep apnea (adult) (pediatric): Secondary | ICD-10-CM

## 2018-04-23 DIAGNOSIS — Z6841 Body Mass Index (BMI) 40.0 and over, adult: Secondary | ICD-10-CM | POA: Diagnosis not present

## 2018-04-23 DIAGNOSIS — N413 Prostatocystitis: Secondary | ICD-10-CM | POA: Diagnosis not present

## 2018-04-23 DIAGNOSIS — R402364 Coma scale, best motor response, obeys commands, 24 hours or more after hospital admission: Secondary | ICD-10-CM | POA: Diagnosis not present

## 2018-04-23 DIAGNOSIS — A419 Sepsis, unspecified organism: Principal | ICD-10-CM | POA: Diagnosis present

## 2018-04-23 DIAGNOSIS — L293 Anogenital pruritus, unspecified: Secondary | ICD-10-CM | POA: Diagnosis not present

## 2018-04-23 DIAGNOSIS — Z9989 Dependence on other enabling machines and devices: Secondary | ICD-10-CM

## 2018-04-23 DIAGNOSIS — R402254 Coma scale, best verbal response, oriented, 24 hours or more after hospital admission: Secondary | ICD-10-CM | POA: Diagnosis not present

## 2018-04-23 DIAGNOSIS — Z87891 Personal history of nicotine dependence: Secondary | ICD-10-CM | POA: Diagnosis not present

## 2018-04-23 DIAGNOSIS — R109 Unspecified abdominal pain: Secondary | ICD-10-CM | POA: Diagnosis not present

## 2018-04-23 DIAGNOSIS — N401 Enlarged prostate with lower urinary tract symptoms: Secondary | ICD-10-CM | POA: Diagnosis not present

## 2018-04-23 DIAGNOSIS — R509 Fever, unspecified: Secondary | ICD-10-CM | POA: Diagnosis not present

## 2018-04-23 MED ORDER — SODIUM CHLORIDE 0.9 % IV BOLUS (SEPSIS)
1000.0000 mL | Freq: Once | INTRAVENOUS | Status: AC
  Administered 2018-04-24: 1000 mL via INTRAVENOUS

## 2018-04-23 MED ORDER — SODIUM CHLORIDE 0.9 % IV SOLN
2.0000 g | Freq: Once | INTRAVENOUS | Status: AC
  Administered 2018-04-24: 2 g via INTRAVENOUS
  Filled 2018-04-23: qty 2

## 2018-04-23 MED ORDER — SODIUM CHLORIDE 0.9 % IV BOLUS (SEPSIS)
600.0000 mL | Freq: Once | INTRAVENOUS | Status: AC
  Administered 2018-04-24: 600 mL via INTRAVENOUS

## 2018-04-23 NOTE — ED Notes (Signed)
Urine Sample with culture sent to lab.

## 2018-04-23 NOTE — ED Notes (Signed)
EKG given to Dr. Tomi Bamberger (male)

## 2018-04-23 NOTE — ED Triage Notes (Signed)
Patient is having burning with urination. Patient is complaining of chills and fever.

## 2018-04-23 NOTE — ED Provider Notes (Signed)
[facility] DEPT Provider Note   CSN: [id number] Arrival date & time: 04/23/18  2101  Time seen 23:04 PM    History   Chief Complaint Chief Complaint  Patient presents with  . Fever  . Urinary Retention    HPI David Rangel is a 47 y.o. male.  HPI patient had a prostate biopsy done on August 28.  He reports he was given antibiotics prior to the procedure but not afterwards.  On September 2 he started feeling bad.  He states he had fever, chills, myalgias, and felt very dizzy.  He states he went to [facility] ED where he had IV fluids and antibiotics and he felt better and was discharged.  However he was called back to be admitted when he had positive blood cultures and urine cultures.  He was admitted on September 5 and discharged on September 6.  He had a consult with infectious disease and he was discharged home on Bactrim which he took for a total of 14 days.  He finished his antibiotics on September 18.  He states he felt fine until today when he started having chills again about 10 AM that lasted until 2 PM.  He went to his primary care doctor at 134 previously scheduled follow-up visit and he had a fever of 102.4.  He states he is having urinary frequency and urgency and he is today starting to have left flank pain.  He is getting a myalgias again.  He denies cough, rhinorrhea, sneezing, sore throat, nausea, or vomiting.  He has not had a history of kidney stones in the past.He also notes he is still having blood in his semen since the bx. does report the biopsy showed no change since his prior biopsy 10 years ago.  He had no problems with that biopsy.  PCP Cletis Athens, MD [facility]rology [facility] in [place] Past Medical History:  Diagnosis Date  . Elevated PSA   . Hypertension   . Sleep apnea     Patient Active Problem List   Diagnosis Date Noted  . HTN (hypertension) 04/05/2018  . Obesity 04/05/2018  . OSA (obstructive sleep apnea) 04/05/2018    . 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 E. coli UTI 04/05/2018  . Acute prostatitis   . Bacteremia due to Gram-negative bacteria   . Bacteremia 04/04/2018  . Prostate cancer ([place]) 03/08/2018    Past Surgical History:  Procedure Laterality Date  . GASTRIC BYPASS  2009  . PROSTATE BIOPSY  2009   ?postitive        Home Medications    Prior to Admission medications   Medication Sig Start Date End Date Taking? Authorizing Provider  acetaminophen (TYLENOL) 500 MG tablet Take 500 mg by mouth every 6 (six) hours as needed for mild pain.   Yes [provider]  albuterol (PROVENTIL HFA;VENTOLIN HFA) 108 (90 Base) MCG/ACT inhaler Inhale 1-2 puffs into the lungs every 6 (six) hours as needed for wheezing or shortness of breath.   Yes [provider]  amLODipine (NORVASC) 10 MG tablet Take 10 mg by mouth daily. 01/08/18  Yes [provider]  losartan-hydrochlorothiazide (HYZAAR) 100-25 MG tablet Take 1 tablet by mouth daily. 01/08/18  Yes [provider]  Multiple Vitamin (MULTIVITAMIN WITH MINERALS) TABS tablet Take 1 tablet by mouth daily.   Yes [provider]  tadalafil (ADCIRCA/CIALIS) 20 MG tablet Take 20 mg by mouth once a week. 01/22/18  Yes [provider]  Family History Family History  Problem Relation Age of Onset  . Prostate cancer Paternal Uncle   . Prostate cancer Maternal Uncle   . Prostate cancer Father   . Prostate cancer Paternal Grandfather   . Bladder Cancer Neg Hx   . Kidney cancer Neg Hx     Social History Social History   Tobacco Use  . Smoking status: Former Research scientist (life sciences)  . Smokeless tobacco: Never Used  . Tobacco comment: quit in 09/2015  Substance Use Topics  . Alcohol use: Yes    Alcohol/week: 2.0 standard drinks    Types: 2 Cans of beer per week  . Drug use: No  employed Lives with spouse Smokes cigars on occassion   Allergies   Cashew nut oil   Review of Systems Review of  Systems  All other systems reviewed and are negative.    Physical Exam Updated Vital Signs BP 133/72   Pulse 97   Temp 100.2 F (37.9 C) (Oral)   Resp (!) 23   Ht 6\' 2"  (1.88 m)   Wt (!) 185.1 kg   SpO2 97%   BMI 52.38 kg/m   Physical Exam  Constitutional: He is oriented to person, place, and time. He appears well-developed and well-nourished.  Non-toxic appearance. He does not appear ill. No distress.  obese  HENT:  Head: Normocephalic and atraumatic.  Right Ear: External ear normal.  Left Ear: External ear normal.  Nose: Nose normal. No mucosal edema or rhinorrhea.  Mouth/Throat: Oropharynx is clear and moist and mucous membranes are normal. No dental abscesses or uvula swelling.  Eyes: Pupils are equal, round, and reactive to light. Conjunctivae and EOM are normal.  Neck: Normal range of motion and full passive range of motion without pain. Neck supple.  Cardiovascular: Normal rate, regular rhythm and normal heart sounds. Exam reveals no gallop and no friction rub.  No murmur heard. Pulmonary/Chest: Effort normal and breath sounds normal. No respiratory distress. He has no wheezes. He has no rhonchi. He has no rales. He exhibits no tenderness and no crepitus.  Abdominal: Soft. Normal appearance and bowel sounds are normal. He exhibits no distension. There is no tenderness. There is no rebound and no guarding.  Left CVAT  Musculoskeletal: Normal range of motion. He exhibits no edema or tenderness.  Moves all extremities well.   Neurological: He is alert and oriented to person, place, and time. He has normal strength. No cranial nerve deficit.  Skin: Skin is warm, dry and intact. No rash noted. No erythema. No pallor.  Psychiatric: He has a normal mood and affect. His speech is normal and behavior is normal. His mood appears not anxious.  Nursing note and vitals reviewed.    ED Treatments / Results  Labs (all labs ordered are listed, but only abnormal results are  displayed) Results for orders placed or performed during the hospital encounter of 04/23/18  Comprehensive metabolic panel  Result Value Ref Range   Sodium 142 135 - 145 mmol/L   Potassium 3.6 3.5 - 5.1 mmol/L   Chloride 104 98 - 111 mmol/L   CO2 29 22 - 32 mmol/L   Glucose, Bld 102 (H) 70 - 99 mg/dL   BUN 13 6 - 20 mg/dL   Creatinine, Ser 1.23 0.61 - 1.24 mg/dL   Calcium 9.0 8.9 - 10.3 mg/dL   Total Protein 7.4 6.5 - 8.1 g/dL   Albumin 3.6 3.5 - 5.0 g/dL   AST 18 15 - 41 U/L   ALT  17 0 - 44 U/L   Alkaline Phosphatase 56 38 - 126 U/L   Total Bilirubin 1.0 0.3 - 1.2 mg/dL   GFR calc non Af Amer >60 >60 mL/min   GFR calc Af Amer >60 >60 mL/min   Anion gap 9 5 - 15  CBC with Differential  Result Value Ref Range   WBC 19.2 (H) 4.0 - 10.5 K/uL   RBC 4.50 4.22 - 5.81 MIL/uL   Hemoglobin 12.7 (L) 13.0 - 17.0 g/dL   HCT 38.5 (L) 39.0 - 52.0 %   MCV 85.6 78.0 - 100.0 fL   MCH 28.2 26.0 - 34.0 pg   MCHC 33.0 30.0 - 36.0 g/dL   RDW 14.2 11.5 - 15.5 %   Platelets 292 150 - 400 K/uL   Neutrophils Relative % 79 %   Neutro Abs 15.1 (H) 1.7 - 7.7 K/uL   Lymphocytes Relative 10 %   Lymphs Abs 1.9 0.7 - 4.0 K/uL   Monocytes Relative 11 %   Monocytes Absolute 2.1 (H) 0.1 - 1.0 K/uL   Eosinophils Relative 0 %   Eosinophils Absolute 0.0 0.0 - 0.7 K/uL   Basophils Relative 0 %   Basophils Absolute 0.0 0.0 - 0.1 K/uL  Protime-INR  Result Value Ref Range   Prothrombin Time 14.8 11.4 - 15.2 seconds   INR 1.17   Urinalysis, Routine w reflex microscopic  Result Value Ref Range   Color, Urine YELLOW YELLOW   APPearance CLEAR CLEAR   Specific Gravity, Urine 1.016 1.005 - 1.030   pH 6.0 5.0 - 8.0   Glucose, UA NEGATIVE NEGATIVE mg/dL   Hgb urine dipstick NEGATIVE NEGATIVE   Bilirubin Urine NEGATIVE NEGATIVE   Ketones, ur NEGATIVE NEGATIVE mg/dL   Protein, ur NEGATIVE NEGATIVE mg/dL   Nitrite NEGATIVE NEGATIVE   Leukocytes, UA TRACE (A) NEGATIVE   RBC / HPF 0-5 0 - 5 RBC/hpf   WBC, UA  11-20 0 - 5 WBC/hpf   Bacteria, UA NONE SEEN NONE SEEN   Squamous Epithelial / LPF 0-5 0 - 5   Mucus PRESENT   I-Stat CG4 Lactic Acid, ED  Result Value Ref Range   Lactic Acid, Venous 1.64 0.5 - 1.9 mmol/L   Laboratory interpretation all normal except leukocytosis, UTI    EKG EKG Interpretation  Date/Time:  Tuesday April 23 2018 23:00:34 EDT Ventricular Rate:  95 PR Interval:    QRS Duration: 100 QT Interval:  325 QTC Calculation: 409 R Axis:   11 Text Interpretation:  Sinus rhythm Borderline T wave abnormalities Baseline wander in lead(s) V1 No significant change since last tracing 03 Sep 2015 Confirmed by Rolland Porter [phone]) on 04/24/2018 12:04:31 AM   Radiology Dg Chest 2 View  Result Date: 04/23/2018 CLINICAL DATA:  Chills and fever EXAM: CHEST - 2 VIEW COMPARISON:  None. FINDINGS: The heart size and mediastinal contours are within normal limits. Both lungs are clear. The visualized skeletal structures are unremarkable. IMPRESSION: No active cardiopulmonary disease. Electronically Signed   By: Ashley Royalty M.D.   On: 04/23/2018 23:13   Ct Renal Stone Study  Result Date: 04/23/2018 CLINICAL DATA:  47 year old male with recent urosepsis now with fever and left flank pain. EXAM: CT ABDOMEN AND PELVIS WITHOUT CONTRAST TECHNIQUE: Multidetector CT imaging of the abdomen and pelvis was performed following the standard protocol without IV contrast. COMPARISON:  None. FINDINGS: Evaluation of this exam is limited in the absence of intravenous contrast. Lower chest: The visualized lung bases are clear.  No intra-abdominal free air or free fluid. Hepatobiliary: Mild fatty infiltration of the liver. No intrahepatic biliary ductal dilatation. The gallbladder is unremarkable. Pancreas: Unremarkable. No pancreatic ductal dilatation or surrounding inflammatory changes. Spleen: Normal in size without focal abnormality. Adrenals/Urinary Tract: The adrenal glands are unremarkable. Faint ill-defined  area of hypodensity in the superior pole of the left kidney is not well characterized but may represent a cyst. There is no hydronephrosis or nephrolithiasis on either side. The visualized ureters and urinary bladder appear unremarkable. Stomach/Bowel: Postsurgical changes of gastric bypass. No bowel obstruction or active inflammation. Normal appendix. Vascular/Lymphatic: The abdominal aorta and IVC are grossly unremarkable on this noncontrast CT. No portal venous gas. There is no adenopathy. Reproductive: The prostate and seminal vesicles are grossly unremarkable. No pelvic mass. Other: None Musculoskeletal: Degenerative changes primarily at L5-S1. No acute osseous pathology. IMPRESSION: No acute intra-abdominopelvic pathology. Specifically there is no hydronephrosis or nephrolithiasis. Electronically Signed   By: Anner Crete M.D.   On: 04/23/2018 23:53   Urine culture 04/03/2018 Procedures Procedures (including critical care time)  Specimen Description URINE, CLEAN CATCH  Performed at [facility], [address] [place], [place] [address]   Special Requests NONE  Performed at [facility], [address] [place], [place] [address]   Culture >=100,000 COLONIES/mL ESCHERICHIA COLIAbnormal    Report Status 04/05/2018 FINAL   Organism ID, Bacteria ESCHERICHIA COLIAbnormal    Resulting Agency [facility]  Susceptibility    Escherichia coli    MIC    AMPICILLIN >=32 RESIST... Resistant    AMPICILLIN/SULBACTAM 16 INTERMED... Intermediate    CEFAZOLIN <=4 SENSITIVE  Sensitive    CEFTRIAXONE <=1 SENSITIVE  Sensitive    CIPROFLOXACIN 0.5 SENSITIVE  Sensitive    Extended ESBL NEGATIVE  Sensitive    GENTAMICIN <=1 SENSITIVE  Sensitive    IMIPENEM <=0.25 SENS... Sensitive    NITROFURANTOIN <=16 SENSIT... Sensitive    PIP/TAZO <=4 SENSITIVE  Sensitive    TRIMETH/SULFA <=20 SENSIT... Sensitive         Susceptibility Comments   Escherichia coli  >=100,000 COLONIES/mL  ESCHERICHIA COLI      Specimen Collected: 04/03/18 07:43 Last Resulted: 04/05/18 07:42        Blood Culture ID Panel April 03, 2018   Ref Range & Units 2wk ago  Enterococcus species NOT DETECTED NOT DETECTED   Listeria monocytogenes NOT DETECTED NOT DETECTED   Staphylococcus species NOT DETECTED NOT DETECTED   Staphylococcus aureus NOT DETECTED NOT DETECTED   Streptococcus species NOT DETECTED NOT DETECTED   Streptococcus agalactiae NOT DETECTED NOT DETECTED   Streptococcus pneumoniae NOT DETECTED NOT DETECTED   Streptococcus pyogenes NOT DETECTED NOT DETECTED   Acinetobacter baumannii NOT DETECTED NOT DETECTED   Enterobacteriaceae species NOT DETECTED DETECTEDAbnormal    Comment: Enterobacteriaceae represent a large family of gram negative bacteria, not a single organism. Refer to culture for further identification.  CRITICAL RESULT CALLED TO, READ BACK BY AND VERIFIED WITH:  K NEAL RN [id number] 04/04/18 A BROWNING   Enterobacter cloacae complex NOT DETECTED NOT DETECTED   Escherichia coli NOT DETECTED NOT DETECTED   Klebsiella oxytoca NOT DETECTED NOT DETECTED   Klebsiella pneumoniae NOT DETECTED NOT DETECTED   Proteus species NOT DETECTED NOT DETECTED   Serratia marcescens NOT DETECTED NOT DETECTED   Carbapenem resistance NOT DETECTED NOT DETECTED   Haemophilus influenzae NOT DETECTED NOT DETECTED   Neisseria meningitidis NOT DETECTED NOT DETECTED   Pseudomonas aeruginosa NOT  DETECTED NOT DETECTED   Candida albicans NOT DETECTED NOT DETECTED   Candida glabrata NOT DETECTED NOT DETECTED   Candida krusei NOT DETECTED NOT DETECTED   Candida parapsilosis NOT DETECTED NOT DETECTED   Candida tropicalis NOT DETECTED NOT DETECTED   Comment: Performed at [facility], [place] [address], [place], [place] [address]  Resulting Agency  [facility]      Specimen Collected: 04/03/18 07:45 Last Resulted: 04/04/18 04:49         Medications Ordered in ED Medications  sodium  chloride 0.9 % bolus 1,000 mL (1,000 mLs Intravenous New Bag/Given 04/24/18 0025)    And  sodium chloride 0.9 % bolus 1,000 mL (has no administration in time range)    And  sodium chloride 0.9 % bolus 600 mL (has no administration in time range)  ceFEPIme (MAXIPIME) 2 g in sodium chloride 0.9 % 100 mL IVPB (2 g Intravenous New Bag/Given 04/24/18 0032)     Initial Impression / Assessment and Plan / ED Course  I have reviewed the triage vital signs and the nursing notes.  Pertinent labs & imaging results that were available during my care of the patient were reviewed by me and considered in my medical decision making (see chart for details).    Patient was started on sepsis protocol however code sepsis was not called.  He was given IV fluids, his fever was treated, and he was started on IV antibiotics, Maxipime.  Due to the new symptom of left flank pain concern was for pyelonephritis or a renal stone, so CT renal was done to evaluate that further.  1 AM patient was updated on his test results.  He is agreeable for admission.  Consult to hospitalist was done.  1:22 AM Dr. Harvest Forest, hospitalist will admit.    Final Clinical Impressions(s) / ED Diagnoses   Final diagnoses:  Febrile urinary tract infection    Plan admission  Rolland Porter, MD, Barbette Or, MD 04/24/18 [phone]

## 2018-04-23 NOTE — Progress Notes (Signed)
A consult was received from an ED physician for Cefepime per pharmacy dosing.  The patient's profile has been reviewed for ht/wt/allergies/indication/available labs.   A one time order has been placed for Cefepime 2gm iv x1.  Further antibiotics/pharmacy consults should be ordered by admitting physician if indicated.                       Thank you, [name] 04/23/2018  11:41 PM

## 2018-04-24 DIAGNOSIS — C61 Malignant neoplasm of prostate: Secondary | ICD-10-CM | POA: Diagnosis present

## 2018-04-24 DIAGNOSIS — Z9884 Bariatric surgery status: Secondary | ICD-10-CM | POA: Diagnosis not present

## 2018-04-24 DIAGNOSIS — G4733 Obstructive sleep apnea (adult) (pediatric): Secondary | ICD-10-CM

## 2018-04-24 DIAGNOSIS — Z9989 Dependence on other enabling machines and devices: Secondary | ICD-10-CM | POA: Diagnosis not present

## 2018-04-24 DIAGNOSIS — I10 Essential (primary) hypertension: Secondary | ICD-10-CM | POA: Diagnosis not present

## 2018-04-24 DIAGNOSIS — R402144 Coma scale, eyes open, spontaneous, 24 hours or more after hospital admission: Secondary | ICD-10-CM | POA: Diagnosis not present

## 2018-04-24 DIAGNOSIS — Z79899 Other long term (current) drug therapy: Secondary | ICD-10-CM | POA: Diagnosis not present

## 2018-04-24 DIAGNOSIS — Z91018 Allergy to other foods: Secondary | ICD-10-CM | POA: Diagnosis not present

## 2018-04-24 DIAGNOSIS — N390 Urinary tract infection, site not specified: Secondary | ICD-10-CM | POA: Diagnosis not present

## 2018-04-24 DIAGNOSIS — R402364 Coma scale, best motor response, obeys commands, 24 hours or more after hospital admission: Secondary | ICD-10-CM | POA: Diagnosis not present

## 2018-04-24 DIAGNOSIS — Z6841 Body Mass Index (BMI) 40.0 and over, adult: Secondary | ICD-10-CM

## 2018-04-24 DIAGNOSIS — Z87891 Personal history of nicotine dependence: Secondary | ICD-10-CM | POA: Diagnosis not present

## 2018-04-24 DIAGNOSIS — R402254 Coma scale, best verbal response, oriented, 24 hours or more after hospital admission: Secondary | ICD-10-CM | POA: Diagnosis not present

## 2018-04-24 DIAGNOSIS — A419 Sepsis, unspecified organism: Secondary | ICD-10-CM | POA: Diagnosis not present

## 2018-04-24 LAB — COMPREHENSIVE METABOLIC PANEL
ALK PHOS: 56 U/L (ref 38–126)
ALT: 17 U/L (ref 0–44)
AST: 18 U/L (ref 15–41)
Albumin: 3.6 g/dL (ref 3.5–5.0)
Anion gap: 9 (ref 5–15)
BUN: 13 mg/dL (ref 6–20)
CALCIUM: 9 mg/dL (ref 8.9–10.3)
CHLORIDE: 104 mmol/L (ref 98–111)
CO2: 29 mmol/L (ref 22–32)
CREATININE: 1.23 mg/dL (ref 0.61–1.24)
GFR calc Af Amer: >60 mL/min (ref >60)
Glucose, Bld: 102 mg/dL — ABNORMAL HIGH (ref 70–99)
Potassium: 3.6 mmol/L (ref 3.5–5.1)
Sodium: 142 mmol/L (ref 135–145)
Total Bilirubin: 1 mg/dL (ref 0.3–1.2)
Total Protein: 7.4 g/dL (ref 6.5–8.1)

## 2018-04-24 LAB — URINALYSIS, ROUTINE W REFLEX MICROSCOPIC
Bacteria, UA: NONE SEEN
Bilirubin Urine: NEGATIVE
Glucose, UA: NEGATIVE mg/dL
Hgb urine dipstick: NEGATIVE
Ketones, ur: NEGATIVE mg/dL
Nitrite: NEGATIVE
Protein, ur: NEGATIVE mg/dL
SPECIFIC GRAVITY, URINE: 1.016 (ref 1.005–1.030)
pH: 6 (ref 5.0–8.0)

## 2018-04-24 LAB — BASIC METABOLIC PANEL
Anion gap: 6 (ref 5–15)
BUN: 11 mg/dL (ref 6–20)
CHLORIDE: 107 mmol/L (ref 98–111)
CO2: 25 mmol/L (ref 22–32)
Calcium: 8.1 mg/dL — ABNORMAL LOW (ref 8.9–10.3)
Creatinine, Ser: 1.21 mg/dL (ref 0.61–1.24)
GFR calc Af Amer: >60 mL/min (ref >60)
GLUCOSE: 108 mg/dL — AB (ref 70–99)
POTASSIUM: 3.2 mmol/L — AB (ref 3.5–5.1)
Sodium: 138 mmol/L (ref 135–145)

## 2018-04-24 LAB — CBC WITH DIFFERENTIAL/PLATELET
Basophils Absolute: 0 10*3/uL (ref 0.0–0.1)
Basophils Relative: 0 %
EOS PCT: 0 %
Eosinophils Absolute: 0 10*3/uL (ref 0.0–0.7)
HCT: 38.5 % — ABNORMAL LOW (ref 39.0–52.0)
Hemoglobin: 12.7 g/dL — ABNORMAL LOW (ref 13.0–17.0)
LYMPHS ABS: 1.9 10*3/uL (ref 0.7–4.0)
LYMPHS PCT: 10 %
MCH: 28.2 pg (ref 26.0–34.0)
MCHC: 33 g/dL (ref 30.0–36.0)
MCV: 85.6 fL (ref 78.0–100.0)
MONO ABS: 2.1 10*3/uL — AB (ref 0.1–1.0)
Monocytes Relative: 11 %
Neutro Abs: 15.1 10*3/uL — ABNORMAL HIGH (ref 1.7–7.7)
Neutrophils Relative %: 79 %
PLATELETS: 292 10*3/uL (ref 150–400)
RBC: 4.5 MIL/uL (ref 4.22–5.81)
RDW: 14.2 % (ref 11.5–15.5)
WBC: 19.2 10*3/uL — ABNORMAL HIGH (ref 4.0–10.5)

## 2018-04-24 LAB — CBC
HCT: 34 % — ABNORMAL LOW (ref 39.0–52.0)
Hemoglobin: 11.3 g/dL — ABNORMAL LOW (ref 13.0–17.0)
MCH: 28.5 pg (ref 26.0–34.0)
MCHC: 33.2 g/dL (ref 30.0–36.0)
MCV: 85.6 fL (ref 78.0–100.0)
PLATELETS: 269 10*3/uL (ref 150–400)
RBC: 3.97 MIL/uL — AB (ref 4.22–5.81)
RDW: 14.3 % (ref 11.5–15.5)
WBC: 17 10*3/uL — ABNORMAL HIGH (ref 4.0–10.5)

## 2018-04-24 LAB — PROTIME-INR
INR: 1.17
Prothrombin Time: 14.8 seconds (ref 11.4–15.2)

## 2018-04-24 LAB — PROCALCITONIN: Procalcitonin: 0.39 ng/mL

## 2018-04-24 LAB — I-STAT CG4 LACTIC ACID, ED: LACTIC ACID, VENOUS: 1.64 mmol/L (ref 0.5–1.9)

## 2018-04-24 MED ORDER — LOSARTAN POTASSIUM 50 MG PO TABS
100.0000 mg | ORAL_TABLET | Freq: Every day | ORAL | Status: DC
  Administered 2018-04-24: 100 mg via ORAL
  Filled 2018-04-24 (×2): qty 2

## 2018-04-24 MED ORDER — ONDANSETRON HCL 4 MG PO TABS: 4.0000 mg | ORAL_TABLET | Freq: Four times a day (QID) | ORAL | Status: DC | PRN

## 2018-04-24 MED ORDER — AMLODIPINE BESYLATE 5 MG PO TABS
10.0000 mg | ORAL_TABLET | Freq: Every day | ORAL | Status: DC
  Administered 2018-04-24: 10 mg via ORAL
  Filled 2018-04-24 (×2): qty 2

## 2018-04-24 MED ORDER — LOSARTAN POTASSIUM-HCTZ 100-25 MG PO TABS: 1.0000 | ORAL_TABLET | Freq: Every day | ORAL | Status: DC

## 2018-04-24 MED ORDER — ONDANSETRON HCL 4 MG/2ML IJ SOLN: 4.0000 mg | Freq: Four times a day (QID) | INTRAMUSCULAR | Status: DC | PRN

## 2018-04-24 MED ORDER — ACETAMINOPHEN 325 MG PO TABS: 650.0000 mg | ORAL_TABLET | Freq: Four times a day (QID) | ORAL | Status: DC | PRN

## 2018-04-24 MED ORDER — ACETAMINOPHEN 650 MG RE SUPP: 650.0000 mg | Freq: Four times a day (QID) | RECTAL | Status: DC | PRN

## 2018-04-24 MED ORDER — SODIUM CHLORIDE 0.9 % IV SOLN
2.0000 g | Freq: Two times a day (BID) | INTRAVENOUS | Status: DC
  Administered 2018-04-24 – 2018-04-25 (×3): 2 g via INTRAVENOUS
  Filled 2018-04-24 (×3): qty 2

## 2018-04-24 MED ORDER — MAGNESIUM SULFATE 2 GM/50ML IV SOLN
2.0000 g | Freq: Once | INTRAVENOUS | Status: AC
  Administered 2018-04-24: 2 g via INTRAVENOUS
  Filled 2018-04-24: qty 50

## 2018-04-24 MED ORDER — POTASSIUM CHLORIDE CRYS ER 20 MEQ PO TBCR
40.0000 meq | EXTENDED_RELEASE_TABLET | Freq: Once | ORAL | Status: AC
  Administered 2018-04-24: 40 meq via ORAL
  Filled 2018-04-24: qty 2

## 2018-04-24 MED ORDER — SODIUM CHLORIDE 0.9 % IV SOLN
Freq: Once | INTRAVENOUS | Status: AC
  Administered 2018-04-24: 03:00:00 via INTRAVENOUS

## 2018-04-24 MED ORDER — ENOXAPARIN SODIUM 80 MG/0.8ML ~~LOC~~ SOLN
80.0000 mg | SUBCUTANEOUS | Status: DC
  Administered 2018-04-24 – 2018-04-25 (×2): 80 mg via SUBCUTANEOUS
  Filled 2018-04-24 (×2): qty 0.8

## 2018-04-24 MED ORDER — HYDROCHLOROTHIAZIDE 25 MG PO TABS
25.0000 mg | ORAL_TABLET | Freq: Every day | ORAL | Status: DC
  Administered 2018-04-24: 25 mg via ORAL
  Filled 2018-04-24 (×2): qty 1

## 2018-04-24 MED ORDER — ACETAMINOPHEN 325 MG PO TABS
650.0000 mg | ORAL_TABLET | ORAL | Status: AC
  Administered 2018-04-24: 650 mg via ORAL
  Filled 2018-04-24: qty 2

## 2018-04-24 NOTE — ED Notes (Signed)
Bed: WA14 Expected date:  Expected time:  Means of arrival:  Comments: Res B 

## 2018-04-24 NOTE — Progress Notes (Signed)
Pharmacy Antibiotic Note  David Rangel is a 47 y.o. male admitted on 04/23/2018 with UTI.  Pharmacy has been consulted for Cefepime dosing.  Plan: Cefepime 2gm iv q12hr  Height: 6\' 2"  (188 cm) Weight: (!) 408 lb (185.1 kg) IBW/kg (Calculated) : 82.2  Temp (24hrs), Avg:100.2 F (37.9 C), Min:99.1 F (37.3 C), Max:101.4 F (38.6 C)  Recent Labs  Lab 04/24/18 0008 04/24/18 0018 04/24/18 0437  WBC 19.2*  --  17.0*  CREATININE 1.23  --   --   LATICACIDVEN  --  1.64  --     Estimated Creatinine Clearance: 128.2 mL/min (by C-G formula based on SCr of 1.23 mg/dL).    Allergies  Allergen Reactions  . Cashew Nut Oil Anaphylaxis    Antimicrobials this admission: Cefepime 04/23/2018 >>   Dose adjustments this admission: -  Microbiology results: -  Thank you for allowing pharmacy to be a part of this patient's care.  David Rangel 04/24/2018 5:05 AM

## 2018-04-24 NOTE — ED Notes (Signed)
ED TO INPATIENT HANDOFF REPORT  Name/Age/Gender David Rangel 47 y.o. male  Code Status    Code Status Orders  (From admission, onward)         Start     Ordered   04/24/18 0131  Full code  Continuous     04/24/18 0135        Code Status History    Date Active Date Inactive Code Status Order ID Comments User Context   04/04/2018 1924 04/05/2018 2158 Full Code [id number]  Bethena Roys, MD Inpatient      Home/SNF/Other Home  Chief Complaint Fever;Urine Retention  Level of Care/Admitting Diagnosis ED Disposition    ED Disposition Condition [facility] Area: [facility] [[id number]]  Level of Care: Med-Surg [16]  Diagnosis: Sepsis secondary to UTI [facility]) [[id number]]  Admitting Physician: Norval Morton [[id number]]  Attending Physician: Norval Morton [[id number]]  Estimated length of stay: past midnight tomorrow  Certification:: I certify this patient will need inpatient services for at least 2 midnights  PT Class (Do Not Modify): Inpatient [101]  PT Acc Code (Do Not Modify): Private [1]       Medical History Past Medical History:  Diagnosis Date  . Elevated PSA   . Hypertension   . Sleep apnea     Allergies Allergies  Allergen Reactions  . Cashew Nut Oil Anaphylaxis    IV Location/Drains/Wounds Patient Lines/Drains/Airways Status   Active Line/Drains/Airways    Name:   Placement date:   Placement time:   Site:   Days:   Peripheral IV 04/04/18 Right Hand   04/04/18    1800    Hand   20          Labs/Imaging Results for orders placed or performed during the hospital encounter of 04/23/18 (from the past 48 hour(s))  Urinalysis, Routine w reflex microscopic     Status: Abnormal   Collection Time: 04/23/18 11:02 PM  Result Value Ref Range   Color, Urine YELLOW YELLOW   APPearance CLEAR CLEAR   Specific Gravity, Urine 1.016 1.005 - 1.030   pH 6.0 5.0 - 8.0   Glucose, UA NEGATIVE NEGATIVE mg/dL   Hgb urine dipstick  NEGATIVE NEGATIVE   Bilirubin Urine NEGATIVE NEGATIVE   Ketones, ur NEGATIVE NEGATIVE mg/dL   Protein, ur NEGATIVE NEGATIVE mg/dL   Nitrite NEGATIVE NEGATIVE   Leukocytes, UA TRACE (A) NEGATIVE   RBC / HPF 0-5 0 - 5 RBC/hpf   WBC, UA 11-20 0 - 5 WBC/hpf   Bacteria, UA NONE SEEN NONE SEEN   Squamous Epithelial / LPF 0-5 0 - 5   Mucus PRESENT     Comment: Performed at [facility], [place] [address], [place], [place] [address]  Urine culture     Status: None (Preliminary result)   Collection Time: 04/23/18 11:48 PM  Result Value Ref Range   Specimen Description      URINE, CLEAN CATCH Performed at [facility], [place] [address], [place], [place] [address]    Special Requests      NONE Performed at [facility], [place] [address], [place], [place] [address]    Culture PENDING    Report Status PENDING   Comprehensive metabolic panel     Status: Abnormal   Collection Time: 04/24/18 12:08 AM  Result Value Ref Range   Sodium 142 135 - 145 mmol/L   Potassium 3.6 3.5 - 5.1 mmol/L   Chloride  104 98 - 111 mmol/L   CO2 29 22 - 32 mmol/L   Glucose, Bld 102 (H) 70 - 99 mg/dL   BUN 13 6 - 20 mg/dL   Creatinine, Ser 1.23 0.61 - 1.24 mg/dL   Calcium 9.0 8.9 - 10.3 mg/dL   Total Protein 7.4 6.5 - 8.1 g/dL   Albumin 3.6 3.5 - 5.0 g/dL   AST 18 15 - 41 U/L   ALT 17 0 - 44 U/L   Alkaline Phosphatase 56 38 - 126 U/L   Total Bilirubin 1.0 0.3 - 1.2 mg/dL   GFR calc non Af Amer >60 >60 mL/min   GFR calc Af Amer >60 >60 mL/min    Comment: (NOTE) The eGFR has been calculated using the CKD EPI equation. This calculation has not been validated in all clinical situations. eGFR's persistently <60 mL/min signify possible Chronic Kidney Disease.    Anion gap 9 5 - 15    Comment: Performed at [facility], [place] [address], [place], [place] [address]  CBC with Differential     Status: Abnormal   Collection Time: 04/24/18 12:08 AM  Result Value  Ref Range   WBC 19.2 (H) 4.0 - 10.5 K/uL   RBC 4.50 4.22 - 5.81 MIL/uL   Hemoglobin 12.7 (L) 13.0 - 17.0 g/dL   HCT 38.5 (L) 39.0 - 52.0 %   MCV 85.6 78.0 - 100.0 fL   MCH 28.2 26.0 - 34.0 pg   MCHC 33.0 30.0 - 36.0 g/dL   RDW 14.2 11.5 - 15.5 %   Platelets 292 150 - 400 K/uL   Neutrophils Relative % 79 %   Neutro Abs 15.1 (H) 1.7 - 7.7 K/uL   Lymphocytes Relative 10 %   Lymphs Abs 1.9 0.7 - 4.0 K/uL   Monocytes Relative 11 %   Monocytes Absolute 2.1 (H) 0.1 - 1.0 K/uL   Eosinophils Relative 0 %   Eosinophils Absolute 0.0 0.0 - 0.7 K/uL   Basophils Relative 0 %   Basophils Absolute 0.0 0.0 - 0.1 K/uL    Comment: Performed at [facility], [place] [address] [place], [place] [address]  Protime-INR     Status: None   Collection Time: 04/24/18 12:08 AM  Result Value Ref Range   Prothrombin Time 14.8 11.4 - 15.2 seconds   INR 1.17     Comment: Performed at [facility], [place] [address], [place], [place] [address]  Procalcitonin     Status: None   Collection Time: 04/24/18 12:08 AM  Result Value Ref Range   Procalcitonin 0.39 ng/mL    Comment:        Interpretation: PCT (Procalcitonin) <= 0.5 ng/mL: Systemic infection (sepsis) is not likely. Local bacterial infection is possible. (NOTE)       Sepsis PCT Algorithm           Lower Respiratory Tract                                      Infection PCT Algorithm    ----------------------------     ----------------------------         PCT < 0.25 ng/mL                PCT < 0.10 ng/mL         Strongly encourage  Strongly discourage   discontinuation of antibiotics    initiation of antibiotics    ----------------------------     -----------------------------       PCT 0.25 - 0.50 ng/mL            PCT 0.10 - 0.25 ng/mL               OR       >80% decrease in PCT            Discourage initiation of                                            antibiotics      Encourage discontinuation            of antibiotics    ----------------------------     -----------------------------         PCT >= 0.50 ng/mL              PCT 0.26 - 0.50 ng/mL               AND        <80% decrease in PCT             Encourage initiation of                                             antibiotics       Encourage continuation           of antibiotics    ----------------------------     -----------------------------        PCT >= 0.50 ng/mL                  PCT > 0.50 ng/mL               AND         increase in PCT                  Strongly encourage                                      initiation of antibiotics    Strongly encourage escalation           of antibiotics                                     -----------------------------                                           PCT <= 0.25 ng/mL                                                 OR                                        >  80% decrease in PCT                                     Discontinue / Do not initiate                                             antibiotics Performed at [place] [address], [place], [place] [address]   I-Stat CG4 Lactic Acid, ED     Status: None   Collection Time: 04/24/18 12:18 AM  Result Value Ref Range   Lactic Acid, Venous 1.64 0.5 - 1.9 mmol/L   Dg Chest 2 View  Result Date: 04/23/2018 CLINICAL DATA:  Chills and fever EXAM: CHEST - 2 VIEW COMPARISON:  None. FINDINGS: The heart size and mediastinal contours are within normal limits. Both lungs are clear. The visualized skeletal structures are unremarkable. IMPRESSION: No active cardiopulmonary disease. Electronically Signed   By: Ashley Royalty M.D.   On: 04/23/2018 23:13   Ct Renal Stone Study  Result Date: 04/23/2018 CLINICAL DATA:  47 year old male with recent urosepsis now with fever and left flank pain. EXAM: CT ABDOMEN AND PELVIS WITHOUT CONTRAST TECHNIQUE: Multidetector CT imaging of the abdomen and pelvis was performed following  the standard protocol without IV contrast. COMPARISON:  None. FINDINGS: Evaluation of this exam is limited in the absence of intravenous contrast. Lower chest: The visualized lung bases are clear. No intra-abdominal free air or free fluid. Hepatobiliary: Mild fatty infiltration of the liver. No intrahepatic biliary ductal dilatation. The gallbladder is unremarkable. Pancreas: Unremarkable. No pancreatic ductal dilatation or surrounding inflammatory changes. Spleen: Normal in size without focal abnormality. Adrenals/Urinary Tract: The adrenal glands are unremarkable. Faint ill-defined area of hypodensity in the superior pole of the left kidney is not well characterized but may represent a cyst. There is no hydronephrosis or nephrolithiasis on either side. The visualized ureters and urinary bladder appear unremarkable. Stomach/Bowel: Postsurgical changes of gastric bypass. No bowel obstruction or active inflammation. Normal appendix. Vascular/Lymphatic: The abdominal aorta and IVC are grossly unremarkable on this noncontrast CT. No portal venous gas. There is no adenopathy. Reproductive: The prostate and seminal vesicles are grossly unremarkable. No pelvic mass. Other: None Musculoskeletal: Degenerative changes primarily at L5-S1. No acute osseous pathology. IMPRESSION: No acute intra-abdominopelvic pathology. Specifically there is no hydronephrosis or nephrolithiasis. Electronically Signed   By: Anner Crete M.D.   On: 04/23/2018 23:53    Pending Labs Unresulted Labs (From admission, onward)    Start     Ordered   04/24/18 0500  CBC  Tomorrow morning,   R     04/24/18 0135   04/24/18 3810  Basic metabolic panel  Tomorrow morning,   R     04/24/18 0135   04/23/18 2240  Culture, blood (Routine x 2)  BLOOD CULTURE X 2,   STAT     04/23/18 2240          Vitals/Pain Today's Vitals   04/23/18 2300 04/23/18 2303 04/23/18 2312 04/24/18 0223  BP: 133/72  133/72   Pulse: 95  97 98  Resp: (!) 0  (!) 23  18  Temp:  100.2 F (37.9 C)  99.9 F (37.7 C)  TempSrc:  Oral  Oral  SpO2: 99%  97%   Weight:  Marland Kitchen)  185.1 kg    Height:      PainSc:        Isolation Precautions No active isolations  Medications Medications  sodium chloride 0.9 % bolus 1,000 mL (0 mLs Intravenous Stopped 04/24/18 0227)    And  sodium chloride 0.9 % bolus 1,000 mL (0 mLs Intravenous Stopped 04/24/18 0212)    And  sodium chloride 0.9 % bolus 600 mL (600 mLs Intravenous New Bag/Given 04/24/18 0228)  amLODipine (NORVASC) tablet 10 mg (has no administration in time range)  losartan-hydrochlorothiazide (HYZAAR) 100-25 MG per tablet 1 tablet (has no administration in time range)  enoxaparin (LOVENOX) injection 40 mg (has no administration in time range)  0.9 %  sodium chloride infusion (has no administration in time range)  ondansetron (ZOFRAN) tablet 4 mg (has no administration in time range)    Or  ondansetron (ZOFRAN) injection 4 mg (has no administration in time range)  acetaminophen (TYLENOL) tablet 650 mg (has no administration in time range)    Or  acetaminophen (TYLENOL) suppository 650 mg (has no administration in time range)  ceFEPIme (MAXIPIME) 2 g in sodium chloride 0.9 % 100 mL IVPB (0 g Intravenous Stopped 04/24/18 0105)  acetaminophen (TYLENOL) tablet 650 mg (650 mg Oral Given 04/24/18 0225)    Mobility walks

## 2018-04-24 NOTE — Progress Notes (Signed)
Lovenox per Pharmacy for DVT Prophylaxis    Pharmacy has been consulted from dosing enoxaparin (lovenox) in this patient for DVT prophylaxis.  The pharmacist has reviewed pertinent labs (Hgb _12.7__; PLT__292_), patient weight (_185__kg) and renal function (CrCl_>90__mL/min) and decided that enoxaparin _80_mg SQ Q24Hrs is appropriate for this patient.  The pharmacy department will sign off at this time.  Please reconsult pharmacy if status changes or for further issues.  Thank you  Cyndia Diver PharmD, BCPS  04/24/2018, 2:59 AM

## 2018-04-24 NOTE — H&P (Addendum)
History and Physical    David Rangel EUM:[id number] DOB: 07-28-1971 DOA: 04/23/2018  Referring MD/NP/PA: Rolland Porter, MD PCP: Cletis Athens, MD  Patient coming from: home  Chief Complaint: Chills  I have personally briefly reviewed patient's old medical records in [place]   HPI: David Rangel is a 47 y.o. male with medical history significant of  prostate cancer, HTN, and OSA; who presents with complaints of chills which started yesterday around 10 AM while he was at work.  Soon thereafter he reported feeling achy all over, and had sudden urge to urinate.  Other associated symptoms included  malaise and some low back/left flank discomfort.  He denies having any other symptoms or recent sick contacts to his knowledge.  Patient did not try anything to relieve his symptoms.  He had gone to see his primary care provider that afternoon and was advised to come to the emergency department for further evaluation.  Patient had previous prostate biopsy on August 28 with only preprocedural antibiotics given.  Shortly thereafter patient began having fever and chills for which seen back in the emergency department on September 4, where patient was given  IV Rocephin and sent home with a prescription for Bactrim.  Urine and blood cultures were obtained during his evaluation.   Patient was notified that blood cultures were positive for Enterobacteriaceae, and was admitted into the hospital the following day.  After discussions with Dr.Campbell of ID patient was discharged home to complete 14-day course of Bactrim which patient completed 1 week ago.  He had been feeling fine, but reported still seeing blood in semen when he ejaculates.  He reports following up with urology and states that there was no significant change noted with the biopsy.   ED Course: Upon admission into the emergency department patient was noted to be febrile up to 101.4 F, pulse 9598, respirations up to 23, and all other vital  signs maintained.  Labs revealed WBC 19.2, hemoglobin 12.7, and lactic acid 1.64.  Urinalysis showed trace leukocytes with 1120 WBCs, but no bacteria seen.   Chest x-ray showed no acute abnormality. Renal CT scan showed no acute abnormalities.  Sepsis was initiated with full 2.6 L bolus, and  empiric antibiotics of cefepime.  Review of Systems  Constitutional: Positive for chills, fever and malaise/fatigue.  HENT: Negative for congestion and sinus pain.   Eyes: Negative for photophobia and pain.  Respiratory: Negative for cough, hemoptysis and shortness of breath.   Cardiovascular: Negative for chest pain and palpitations.  Gastrointestinal: Negative for abdominal pain, nausea and vomiting.  Genitourinary: Positive for flank pain and urgency.  Musculoskeletal: Positive for back pain and myalgias.  Skin: Negative for itching and rash.  Neurological: Negative for dizziness and loss of consciousness.  Psychiatric/Behavioral: Negative for substance abuse. The patient is not nervous/anxious.     Past Medical History:  Diagnosis Date  . Elevated PSA   . Hypertension   . Sleep apnea     Past Surgical History:  Procedure Laterality Date  . GASTRIC BYPASS  2009  . PROSTATE BIOPSY  2009   ?postitive     reports that he has quit smoking. He has never used smokeless tobacco. He reports that he drinks about 2.0 standard drinks of alcohol per week. He reports that he does not use drugs.  Allergies  Allergen Reactions  . Cashew Nut Oil Anaphylaxis    Family History  Problem Relation Age of Onset  . Prostate cancer Paternal Uncle   .  Prostate cancer Maternal Uncle   . Prostate cancer Father   . Prostate cancer Paternal Grandfather   . Bladder Cancer Neg Hx   . Kidney cancer Neg Hx     Prior to Admission medications   Medication Sig Start Date End Date Taking? Authorizing Provider  acetaminophen (TYLENOL) 500 MG tablet Take 500 mg by mouth every 6 (six) hours as needed for mild pain.    Yes [provider]  albuterol (PROVENTIL HFA;VENTOLIN HFA) 108 (90 Base) MCG/ACT inhaler Inhale 1-2 puffs into the lungs every 6 (six) hours as needed for wheezing or shortness of breath.   Yes [provider]  amLODipine (NORVASC) 10 MG tablet Take 10 mg by mouth daily. 01/08/18  Yes [provider]  losartan-hydrochlorothiazide (HYZAAR) 100-25 MG tablet Take 1 tablet by mouth daily. 01/08/18  Yes [provider]  Multiple Vitamin (MULTIVITAMIN WITH MINERALS) TABS tablet Take 1 tablet by mouth daily.   Yes [provider]  tadalafil (ADCIRCA/CIALIS) 20 MG tablet Take 20 mg by mouth once a week. 01/22/18  Yes [provider]    Physical Exam:  Constitutional: NAD, calm, comfortable Vitals:   04/23/18 2241 04/23/18 2300 04/23/18 2303 04/23/18 2312  BP:  133/72  133/72  Pulse:  95  97  Resp:  (!) 0  (!) 23  Temp:   100.2 F (37.9 C)   TempSrc:   Oral   SpO2: 100% 99%  97%  Weight:   (!) 185.1 kg   Height:       Eyes: PERRL, lids and conjunctivae normal ENMT: Mucous membranes are moist. Posterior pharynx clear of any exudate or lesions. Normal dentition.  Neck: normal, supple, no masses, no thyromegaly Respiratory: clear to auscultation bilaterally, no wheezing, no crackles. Normal respiratory effort. No accessory muscle use.  Cardiovascular: Regular rate and rhythm, no murmurs / rubs / gallops.  1+ pitting lower extremity edema. 2+ pedal pulses. No carotid bruits.  Abdomen: no tenderness, no masses palpated.  No CVA tenderness.  No hepatosplenomegaly. Bowel sounds positive.  Musculoskeletal: no clubbing / cyanosis. No joint deformity upper and lower extremities. Good ROM, no contractures. Normal muscle tone.  Skin: no rashes, lesions, ulcers. No induration Neurologic: CN 2-12 grossly intact. Sensation intact, DTR normal. Strength 5/5 in all 4.  Psychiatric: Normal judgment and insight. Alert and oriented x 3. Normal mood.     Labs  on Admission: I have personally reviewed following labs and imaging studies  CBC: Recent Labs  Lab 04/24/18 0008  WBC 19.2*  NEUTROABS 15.1*  HGB 12.7*  HCT 38.5*  MCV 85.6  PLT 175   Basic Metabolic Panel: Recent Labs  Lab 04/24/18 0008  NA 142  K 3.6  CL 104  CO2 29  GLUCOSE 102*  BUN 13  CREATININE 1.23  CALCIUM 9.0   GFR: Estimated Creatinine Clearance: 128.2 mL/min (by C-G formula based on SCr of 1.23 mg/dL). Liver Function Tests: Recent Labs  Lab 04/24/18 0008  AST 18  ALT 17  ALKPHOS 56  BILITOT 1.0  PROT 7.4  ALBUMIN 3.6   No results for input(s): LIPASE, AMYLASE in the last 168 hours. No results for input(s): AMMONIA in the last 168 hours. Coagulation Profile: Recent Labs  Lab 04/24/18 0008  INR 1.17   Cardiac Enzymes: No results for input(s): CKTOTAL, CKMB, CKMBINDEX, TROPONINI in the last 168 hours. BNP (last 3 results) No results for input(s): PROBNP in the last 8760 hours. HbA1C: No results for input(s): HGBA1C in  the last 72 hours. CBG: No results for input(s): GLUCAP in the last 168 hours. Lipid Profile: No results for input(s): CHOL, HDL, LDLCALC, TRIG, CHOLHDL, LDLDIRECT in the last 72 hours. Thyroid Function Tests: No results for input(s): TSH, T4TOTAL, FREET4, T3FREE, THYROIDAB in the last 72 hours. Anemia Panel: No results for input(s): VITAMINB12, FOLATE, FERRITIN, TIBC, IRON, RETICCTPCT in the last 72 hours. Urine analysis:    Component Value Date/Time   COLORURINE YELLOW 04/23/2018 2302   APPEARANCEUR CLEAR 04/23/2018 2302   APPEARANCEUR Clear 03/08/2018 1100   LABSPEC 1.016 04/23/2018 2302   PHURINE 6.0 04/23/2018 2302   GLUCOSEU NEGATIVE 04/23/2018 2302   HGBUR NEGATIVE 04/23/2018 2302   BILIRUBINUR NEGATIVE 04/23/2018 2302   BILIRUBINUR Negative 03/08/2018 1100   KETONESUR NEGATIVE 04/23/2018 2302   PROTEINUR NEGATIVE 04/23/2018 2302   NITRITE NEGATIVE 04/23/2018 2302   LEUKOCYTESUR TRACE (A) 04/23/2018 2302    LEUKOCYTESUR Negative 03/08/2018 1100   Sepsis Labs: No results found for this or any previous visit (from the past 240 hour(s)).   Radiological Exams on Admission: Dg Chest 2 View  Result Date: 04/23/2018 CLINICAL DATA:  Chills and fever EXAM: CHEST - 2 VIEW COMPARISON:  None. FINDINGS: The heart size and mediastinal contours are within normal limits. Both lungs are clear. The visualized skeletal structures are unremarkable. IMPRESSION: No active cardiopulmonary disease. Electronically Signed   By: Ashley Royalty M.D.   On: 04/23/2018 23:13   Ct Renal Stone Study  Result Date: 04/23/2018 CLINICAL DATA:  47 year old male with recent urosepsis now with fever and left flank pain. EXAM: CT ABDOMEN AND PELVIS WITHOUT CONTRAST TECHNIQUE: Multidetector CT imaging of the abdomen and pelvis was performed following the standard protocol without IV contrast. COMPARISON:  None. FINDINGS: Evaluation of this exam is limited in the absence of intravenous contrast. Lower chest: The visualized lung bases are clear. No intra-abdominal free air or free fluid. Hepatobiliary: Mild fatty infiltration of the liver. No intrahepatic biliary ductal dilatation. The gallbladder is unremarkable. Pancreas: Unremarkable. No pancreatic ductal dilatation or surrounding inflammatory changes. Spleen: Normal in size without focal abnormality. Adrenals/Urinary Tract: The adrenal glands are unremarkable. Faint ill-defined area of hypodensity in the superior pole of the left kidney is not well characterized but may represent a cyst. There is no hydronephrosis or nephrolithiasis on either side. The visualized ureters and urinary bladder appear unremarkable. Stomach/Bowel: Postsurgical changes of gastric bypass. No bowel obstruction or active inflammation. Normal appendix. Vascular/Lymphatic: The abdominal aorta and IVC are grossly unremarkable on this noncontrast CT. No portal venous gas. There is no adenopathy. Reproductive: The prostate and  seminal vesicles are grossly unremarkable. No pelvic mass. Other: None Musculoskeletal: Degenerative changes primarily at L5-S1. No acute osseous pathology. IMPRESSION: No acute intra-abdominopelvic pathology. Specifically there is no hydronephrosis or nephrolithiasis. Electronically Signed   By: Anner Crete M.D.   On: 04/23/2018 23:53    EKG: Independently reviewed.  Sinus rhythm at 95 bpm  Assessment/Plan Sepsis 2/2 possible UTI: Patient present with fever up to 101.2 F, tachycardia, and leukocytosis.  Lactic acid was reassuring at 1.64.  Urinalysis noted trace leukocytes, negative nitrates, no bacteria seen, and 11-20 white blood cells.  Patient had just recently been admitted for Enterobacteriaceae bacteremia treated with a 14-day course of Bactrim. - Admit to a MedSurg bed - Follow-up blood and urine cultures - Continue empiric antibiotics of cefepime, de-escalate when medically appropriate - Tylenol as needed for fever  Essential hypertension: Stable - Continue amlodipine and losartan-hydrochlorothiazide  Prostate cancer:  Patient followed by [facility].  Post biopsy patient reports still seeing blood in semen. - Continue outpatient follow-up  Morbid obesity: BMI 52.38  OSA on CPAP - CPAP per RT DVT prophylaxis: Lovenox Code Status: Full Family Communication: Discussed plan of care with the patient and wife whose present via phone. Disposition Plan: Likely discharge home in 2 to 3 days Consults called: none  Admission status: inpatient  Norval Morton MD [facility] Pager [phone]   If 7PM-7AM, please contact night-coverage [email or web address] Password [email or web address]  04/24/2018, 1:17 AM

## 2018-04-24 NOTE — Progress Notes (Signed)
David Rangel is a 47 y.o. male with medical history significant of prostate cancer, HTN, and OSA; who presents with complaints of chills which started yesterday around 10 AM while he was at work.  Soon thereafter he reported feeling achy all over, and had sudden urge to urinate.  Other associated symptoms included  malaise and some low back/left flank discomfort.  He denies having any other symptoms or recent sick contacts to his knowledge.  Patient did not try anything to relieve his symptoms.  He had gone to see his primary care provider that afternoon and was advised to come to the emergency department for further evaluation.  04/24/2018: Patient seen and examined at his bedside.  He has no new complaints.  Please refer to H&P dictated by Dr. Tamala Julian on 04/24/2018 for further details of the assessment and plan.

## 2018-04-25 LAB — CBC
HEMATOCRIT: 37.3 % — AB (ref 39.0–52.0)
HEMOGLOBIN: 12.2 g/dL — AB (ref 13.0–17.0)
MCH: 27.9 pg (ref 26.0–34.0)
MCHC: 32.7 g/dL (ref 30.0–36.0)
MCV: 85.4 fL (ref 78.0–100.0)
Platelets: 277 10*3/uL (ref 150–400)
RBC: 4.37 MIL/uL (ref 4.22–5.81)
RDW: 14.1 % (ref 11.5–15.5)
WBC: 9.5 10*3/uL (ref 4.0–10.5)

## 2018-04-25 LAB — URINE CULTURE: CULTURE: NO GROWTH

## 2018-04-25 LAB — BASIC METABOLIC PANEL
Anion gap: 10 (ref 5–15)
BUN: 10 mg/dL (ref 6–20)
CHLORIDE: 103 mmol/L (ref 98–111)
CO2: 27 mmol/L (ref 22–32)
CREATININE: 0.99 mg/dL (ref 0.61–1.24)
Calcium: 8.6 mg/dL — ABNORMAL LOW (ref 8.9–10.3)
GFR calc Af Amer: >60 mL/min (ref >60)
GFR calc non Af Amer: >60 mL/min (ref >60)
GLUCOSE: 103 mg/dL — AB (ref 70–99)
Potassium: 3.8 mmol/L (ref 3.5–5.1)
Sodium: 140 mmol/L (ref 135–145)

## 2018-04-25 LAB — LACTIC ACID, PLASMA: Lactic Acid, Venous: 1.1 mmol/L (ref 0.5–1.9)

## 2018-04-25 MED ORDER — CEPHALEXIN 500 MG PO CAPS
500.0000 mg | ORAL_CAPSULE | Freq: Three times a day (TID) | ORAL | Status: DC
  Administered 2018-04-25: 500 mg via ORAL
  Filled 2018-04-25: qty 1

## 2018-04-25 MED ORDER — CEPHALEXIN 500 MG PO CAPS: 500.0000 mg | ORAL_CAPSULE | Freq: Three times a day (TID) | ORAL | 0 refills | Status: AC

## 2018-04-25 NOTE — Discharge Summary (Signed)
Discharge Summary  David Rangel [id number] DOB: 16-Mar-1971  PCP: Cletis Athens, MD  Admit date: 04/23/2018 Discharge date: 04/25/2018  Time spent: 25 minutes  Recommendations for Outpatient Follow-up:  1. Follow-up with your PCP 2. Follow-up with your urologist 3. Take your medications as prescribed  Discharge Diagnoses:  Active Hospital Problems   Diagnosis Date Noted  . Sepsis secondary to UTI ([place]) 04/24/2018  . HTN (hypertension) 04/05/2018  . OSA on CPAP 04/05/2018  . Morbid obesity with BMI of 50.0-59.9, adult ([place]) 04/05/2018    Resolved Hospital Problems  No resolved problems to display.    Discharge Condition: Stable  Diet recommendation: Resume previous diet, heart healthy carb modified low-salt diet  Vitals:   04/25/18 0758 04/25/18 1000  BP: (!) 114/54 111/65  Pulse: 72 87  Resp: 18   Temp: 98.5 F (36.9 C) 99 F (37.2 C)  SpO2: 98%     History of present illness:  David Rangel is a 47 y.o. male with medical history significant of prostate cancer, HTN, and OSA; who presents with complaints of chills which started yesterday around 10 AM while he was at work.  Soon thereafter he reported feeling achy all over, and had sudden urge to urinate.  Other associated symptoms included  malaise and some low back/left flank discomfort.  He denies having any other symptoms or recent sick contacts to his knowledge.  Patient did not try anything to relieve his symptoms.  He had gone to see his primary care provider that afternoon and was advised to come to the emergency department for further evaluation.  Patient had previous prostate biopsy on August 28 with only preprocedural antibiotics given.  Shortly thereafter patient began having fever and chills for which seen back in the emergency department on September 4, where patient was given  IV Rocephin and sent home with a prescription for Bactrim.  Urine and blood cultures were obtained during his evaluation.    Patient was notified that blood cultures were positive for Enterobacteriaceae, and was admitted into the hospital the following day.  After discussions with Dr.Campbell of ID patient was discharged home to complete 14-day course of Bactrim which patient completed 1 week ago.  He had been feeling fine, but reported still seeing blood in semen when he ejaculates.  He reports following up with urology and states that there was no significant change noted with the biopsy.   Upon admission into the emergency department patient was noted to be febrile up to 101.4 F, pulse 9598, respirations up to 23, and all other vital signs maintained.  Labs revealed WBC 19.2, hemoglobin 12.7, and lactic acid 1.64.  Urinalysis showed trace leukocytes with 1120 WBCs, but no bacteria seen.   Chest x-ray showed no acute abnormality. Renal CT scan showed no acute abnormalities.  Sepsis was initiated with full 2.6 L bolus, and  empiric antibiotics of cefepime.  04/25/2018: Patient seen and examined at his bedside.  No acute events overnight.  He has no new complaints.  Denies any chills, subjective fever, or dysuria.  On the day of discharge, the patient was hemodynamically stable.  He will need to follow-up with his primary care provider and urologist posthospitalization.   Hospital Course:  Principal Problem:   Sepsis secondary to UTI [facility]) Active Problems:   HTN (hypertension)   Morbid obesity with BMI of 50.0-59.9, adult ([facility])   OSA on CPAP  Sepsis 2/2  complicated UTI:  -Presents with fever up to 101.2 F, tachycardia, and leukocytosis.   -  Urinalysis noted trace leukocytes, 11-20 white blood cells.  -Patient had just recently been admitted for Enterobacteriaceae bacteremia treated with a 14-day course of Bactrim. - Completed empiric antibiotics of cefepime, de-escalate to Keflex 500 mg 3 times daily x7 days  Essential hypertension: Stable -Continue amlodipine  -Strongly recommend low-salt diet due to mild  edema in bilateral lower extremity and amlodipine -Continue to hold off losartan and HCTZ due to soft blood pressure and elevated creatinine on presentation -Follow-up with your PCP posthospitalization  Prostate cancer -Patient followed by [facility].   - Continue outpatient follow-up  Morbid obesity: BMI 52.38 Self-reported 30 pound weight loss in 2 months Continue weight loss with regular physical activity and healthy dieting Follow-up with your PCP  OSA on CPAP - CPAP per RT   Procedures:  None  Consultations:  None  Discharge Exam: BP 111/65 (BP Location: Right Arm)   Pulse 87   Temp 99 F (37.2 C)   Resp 18   Ht 6\' 2"  (1.88 m)   Wt (!) 185.1 kg   SpO2 98%   BMI 52.38 kg/m  . General: 47 y.o. year-old male well developed well nourished in no acute distress.  Alert and oriented x3. . Cardiovascular: Regular rate and rhythm with no rubs or gallops.  No thyromegaly or JVD noted.   Marland Kitchen Respiratory: Clear to auscultation with no wheezes or rales. Good inspiratory effort. . Abdomen: Soft nontender nondistended with normal bowel sounds x4 quadrants. . Musculoskeletal: Trace lower extremity edema. 2/4 pulses in all 4 extremities. . Skin: No ulcerative lesions noted or rashes . Psychiatry: Mood is appropriate for condition and setting  Discharge Instructions You were cared for by a hospitalist during your hospital stay. If you have any questions about your discharge medications or the care you received while you were in the hospital after you are discharged, you can call the unit and asked to speak with the hospitalist on call if the hospitalist that took care of you is not available. Once you are discharged, your primary care physician will handle any further medical issues. Please note that NO REFILLS for any discharge medications will be authorized once you are discharged, as it is imperative that you return to your primary care physician (or establish  a relationship with a primary care physician if you do not have one) for your aftercare needs so that they can reassess your need for medications and monitor your lab values.   Allergies as of 04/25/2018      Reactions   Cashew Nut Oil Anaphylaxis      Medication List    STOP taking these medications   acetaminophen 500 MG tablet Commonly known as:  TYLENOL   losartan-hydrochlorothiazide 100-25 MG tablet Commonly known as:  HYZAAR     TAKE these medications   albuterol 108 (90 Base) MCG/ACT inhaler Commonly known as:  PROVENTIL HFA;VENTOLIN HFA Inhale 1-2 puffs into the lungs every 6 (six) hours as needed for wheezing or shortness of breath.   amLODipine 10 MG tablet Commonly known as:  NORVASC Take 10 mg by mouth daily.   cephALEXin 500 MG capsule Commonly known as:  KEFLEX Take 1 capsule (500 mg total) by mouth 3 (three) times daily for 7 days.   multivitamin with minerals Tabs tablet Take 1 tablet by mouth daily.   tadalafil 20 MG tablet Commonly known as:  ADCIRCA/CIALIS Take 20 mg by mouth once a week.      Allergies  Allergen Reactions  .  Cashew Nut Oil Anaphylaxis   Follow-up Information    Cletis Athens, MD. Call in 1 day(s).   Specialty:  Internal Medicine Why:  Please call for a post hospital follow-up appointment. Contact information: [place] [address] [phone]            The results of significant diagnostics from this hospitalization (including imaging, microbiology, ancillary and laboratory) are listed below for reference.    Significant Diagnostic Studies: Dg Chest 2 View  Result Date: 04/23/2018 CLINICAL DATA:  Chills and fever EXAM: CHEST - 2 VIEW COMPARISON:  None. FINDINGS: The heart size and mediastinal contours are within normal limits. Both lungs are clear. The visualized skeletal structures are unremarkable. IMPRESSION: No active cardiopulmonary disease. Electronically Signed   By: Ashley Royalty M.D.   On:  04/23/2018 23:13   Ct Renal Stone Study  Result Date: 04/23/2018 CLINICAL DATA:  47 year old male with recent urosepsis now with fever and left flank pain. EXAM: CT ABDOMEN AND PELVIS WITHOUT CONTRAST TECHNIQUE: Multidetector CT imaging of the abdomen and pelvis was performed following the standard protocol without IV contrast. COMPARISON:  None. FINDINGS: Evaluation of this exam is limited in the absence of intravenous contrast. Lower chest: The visualized lung bases are clear. No intra-abdominal free air or free fluid. Hepatobiliary: Mild fatty infiltration of the liver. No intrahepatic biliary ductal dilatation. The gallbladder is unremarkable. Pancreas: Unremarkable. No pancreatic ductal dilatation or surrounding inflammatory changes. Spleen: Normal in size without focal abnormality. Adrenals/Urinary Tract: The adrenal glands are unremarkable. Faint ill-defined area of hypodensity in the superior pole of the left kidney is not well characterized but may represent a cyst. There is no hydronephrosis or nephrolithiasis on either side. The visualized ureters and urinary bladder appear unremarkable. Stomach/Bowel: Postsurgical changes of gastric bypass. No bowel obstruction or active inflammation. Normal appendix. Vascular/Lymphatic: The abdominal aorta and IVC are grossly unremarkable on this noncontrast CT. No portal venous gas. There is no adenopathy. Reproductive: The prostate and seminal vesicles are grossly unremarkable. No pelvic mass. Other: None Musculoskeletal: Degenerative changes primarily at L5-S1. No acute osseous pathology. IMPRESSION: No acute intra-abdominopelvic pathology. Specifically there is no hydronephrosis or nephrolithiasis. Electronically Signed   By: Anner Crete M.D.   On: 04/23/2018 23:53    Microbiology: Recent Results (from the past 240 hour(s))  Urine culture     Status: None   Collection Time: 04/23/18 11:48 PM  Result Value Ref Range Status   Specimen Description    Final    URINE, CLEAN CATCH Performed at [facility] [address] [place], [place] [address]    Special Requests NONE  Final   Culture   Final    NO GROWTH Performed at [facility], [place] [address], [place], [place] [address]    Report Status 04/25/2018 FINAL  Final  Culture, blood (Routine x 2)     Status: None (Preliminary result)   Collection Time: 04/24/18 12:02 AM  Result Value Ref Range Status   Specimen Description   Final    BLOOD RIGHT ANTECUBITAL Performed at [facility] [address], [place], [place] [address]    Special Requests   Final    BOTTLES DRAWN AEROBIC AND ANAEROBIC Blood Culture adequate volume Performed at [facility] [address] [place], [place] [address]    Culture   Final    NO GROWTH 1 DAY Performed at [facility], [place] [address] [place], [place]  [address]    Report Status PENDING  Incomplete  Culture, blood (Routine x 2)     Status: None (Preliminary result)   Collection Time: 04/24/18 12:02 AM  Result Value Ref Range Status   Specimen Description   Final    BLOOD LEFT ANTECUBITAL Performed at [facility] [address], [place], [place] [address]    Special Requests   Final    BOTTLES DRAWN AEROBIC AND ANAEROBIC Blood Culture adequate volume Performed at [facility] [address] [place], [place] [address]    Culture   Final    NO GROWTH 1 DAY Performed at [facility], [place] [address], [place], [place] [address]    Report Status PENDING  Incomplete     Labs: Basic Metabolic Panel: Recent Labs  Lab 04/24/18 0008 04/24/18 0437 04/25/18 0517  NA 142 138 140  K 3.6 3.2* 3.8  CL 104 107 103  CO2 29 25 27   GLUCOSE 102* 108* 103*  BUN 13 11 10   CREATININE 1.23 1.21 0.99  CALCIUM 9.0 8.1* 8.6*   Liver Function Tests: Recent Labs  Lab 04/24/18 0008  AST 18  ALT 17  ALKPHOS 56  BILITOT 1.0  PROT  7.4  ALBUMIN 3.6   No results for input(s): LIPASE, AMYLASE in the last 168 hours. No results for input(s): AMMONIA in the last 168 hours. CBC: Recent Labs  Lab 04/24/18 0008 04/24/18 0437 04/25/18 0517  WBC 19.2* 17.0* 9.5  NEUTROABS 15.1*  --   --   HGB 12.7* 11.3* 12.2*  HCT 38.5* 34.0* 37.3*  MCV 85.6 85.6 85.4  PLT 292 269 277   Cardiac Enzymes: No results for input(s): CKTOTAL, CKMB, CKMBINDEX, TROPONINI in the last 168 hours. BNP: BNP (last 3 results) No results for input(s): BNP in the last 8760 hours.  ProBNP (last 3 results) No results for input(s): PROBNP in the last 8760 hours.  CBG: No results for input(s): GLUCAP in the last 168 hours.     Signed:  Kayleen Memos, MD [facility] 04/25/2018, 12:53 PM

## 2018-04-25 NOTE — Progress Notes (Signed)
Pt B/P assessed. B/P is 111/65. MD was notified. MD state B/P meds should be held. MD ordered Cefepime to be administered. MD states Pt will be discharged after his last antibiotic (Keflex) has be given.

## 2018-04-25 NOTE — Progress Notes (Signed)
Discharge and medication instructions reviewed with patient. Questions answered; patient denies further questions. Patient is taking an [place] home. One prescription given to patient. Donne Hazel, RN

## 2018-04-25 NOTE — Progress Notes (Signed)
I have reviewed and concur with this student's documentation.   

## 2018-04-25 NOTE — Discharge Instructions (Signed)
Urinary Tract Infection, Adult °A urinary tract infection (UTI) is an infection of any part of the urinary tract. The urinary tract includes the: °· Kidneys. °· Ureters. °· Bladder. °· Urethra. ° °These organs make, store, and get rid of pee (urine) in the body. °Follow these instructions at home: °· Take over-the-counter and prescription medicines only as told by your doctor. °· If you were prescribed an antibiotic medicine, take it as told by your doctor. Do not stop taking the antibiotic even if you start to feel better. °· Avoid the following drinks: °? Alcohol. °? Caffeine. °? Tea. °? Carbonated drinks. °· Drink enough fluid to keep your pee clear or pale yellow. °· Keep all follow-up visits as told by your doctor. This is important. °· Make sure to: °? Empty your bladder often and completely. Do not to hold pee for long periods of time. °? Empty your bladder before and after sex. °? Wipe from front to back after a bowel movement if you are male. Use each tissue one time when you wipe. °Contact a doctor if: °· You have back pain. °· You have a fever. °· You feel sick to your stomach (nauseous). °· You throw up (vomit). °· Your symptoms do not get better after 3 days. °· Your symptoms go away and then come back. °Get help right away if: °· You have very bad back pain. °· You have very bad lower belly (abdominal) pain. °· You are throwing up and cannot keep down any medicines or water. °This information is not intended to replace advice given to you by your health care provider. Make sure you discuss any questions you have with your health care provider. °Document Released: 01/03/2008 Document Revised: 12/23/2015 Document Reviewed: 06/07/2015 °Elsevier Interactive Patient Education © 2018 [employer] ° °

## 2018-04-29 LAB — CULTURE, BLOOD (ROUTINE X 2)
CULTURE: NO GROWTH
Culture: NO GROWTH
Special Requests: ADEQUATE
Special Requests: ADEQUATE

## 2018-05-01 DIAGNOSIS — A4181 Sepsis due to Enterococcus: Secondary | ICD-10-CM | POA: Diagnosis not present

## 2018-05-01 DIAGNOSIS — Z9989 Dependence on other enabling machines and devices: Secondary | ICD-10-CM | POA: Diagnosis not present

## 2018-05-01 DIAGNOSIS — G4733 Obstructive sleep apnea (adult) (pediatric): Secondary | ICD-10-CM | POA: Diagnosis not present

## 2018-05-01 DIAGNOSIS — N401 Enlarged prostate with lower urinary tract symptoms: Secondary | ICD-10-CM | POA: Diagnosis not present

## 2018-05-13 DIAGNOSIS — G4733 Obstructive sleep apnea (adult) (pediatric): Secondary | ICD-10-CM | POA: Diagnosis not present

## 2018-05-13 DIAGNOSIS — Z9989 Dependence on other enabling machines and devices: Secondary | ICD-10-CM | POA: Diagnosis not present

## 2018-05-13 DIAGNOSIS — L293 Anogenital pruritus, unspecified: Secondary | ICD-10-CM | POA: Diagnosis not present

## 2018-05-13 DIAGNOSIS — N401 Enlarged prostate with lower urinary tract symptoms: Secondary | ICD-10-CM | POA: Diagnosis not present

## 2018-05-21 DIAGNOSIS — G4733 Obstructive sleep apnea (adult) (pediatric): Secondary | ICD-10-CM | POA: Diagnosis not present

## 2018-06-18 DIAGNOSIS — G4733 Obstructive sleep apnea (adult) (pediatric): Secondary | ICD-10-CM | POA: Diagnosis not present

## 2018-08-13 DIAGNOSIS — G4733 Obstructive sleep apnea (adult) (pediatric): Secondary | ICD-10-CM | POA: Diagnosis not present

## 2018-08-13 DIAGNOSIS — N413 Prostatocystitis: Secondary | ICD-10-CM | POA: Diagnosis not present

## 2018-08-13 DIAGNOSIS — N401 Enlarged prostate with lower urinary tract symptoms: Secondary | ICD-10-CM | POA: Diagnosis not present

## 2018-08-14 DIAGNOSIS — M0600 Rheumatoid arthritis without rheumatoid factor, unspecified site: Secondary | ICD-10-CM | POA: Diagnosis not present

## 2018-08-14 DIAGNOSIS — I10 Essential (primary) hypertension: Secondary | ICD-10-CM | POA: Diagnosis not present

## 2018-08-14 DIAGNOSIS — D519 Vitamin B12 deficiency anemia, unspecified: Secondary | ICD-10-CM | POA: Diagnosis not present

## 2018-08-14 DIAGNOSIS — E7849 Other hyperlipidemia: Secondary | ICD-10-CM | POA: Diagnosis not present

## 2018-08-22 DIAGNOSIS — Z9989 Dependence on other enabling machines and devices: Secondary | ICD-10-CM | POA: Diagnosis not present

## 2018-08-22 DIAGNOSIS — L293 Anogenital pruritus, unspecified: Secondary | ICD-10-CM | POA: Diagnosis not present

## 2018-08-22 DIAGNOSIS — G4733 Obstructive sleep apnea (adult) (pediatric): Secondary | ICD-10-CM | POA: Diagnosis not present

## 2018-08-22 DIAGNOSIS — N401 Enlarged prostate with lower urinary tract symptoms: Secondary | ICD-10-CM | POA: Diagnosis not present

## 2018-09-10 ENCOUNTER — Ambulatory Visit: Payer: BLUE CROSS/BLUE SHIELD

## 2018-09-10 ENCOUNTER — Encounter: Payer: Self-pay | Admitting: Podiatry

## 2018-09-17 DIAGNOSIS — G4733 Obstructive sleep apnea (adult) (pediatric): Secondary | ICD-10-CM | POA: Diagnosis not present

## 2018-10-05 NOTE — Progress Notes (Signed)
This encounter was created in error - please disregard.

## 2018-12-20 DIAGNOSIS — G4733 Obstructive sleep apnea (adult) (pediatric): Secondary | ICD-10-CM | POA: Diagnosis not present

## 2019-01-20 DIAGNOSIS — G4733 Obstructive sleep apnea (adult) (pediatric): Secondary | ICD-10-CM | POA: Diagnosis not present

## 2019-04-09 ENCOUNTER — Ambulatory Visit: Payer: BLUE CROSS/BLUE SHIELD | Admitting: Urology

## 2019-04-15 ENCOUNTER — Ambulatory Visit: Payer: BLUE CROSS/BLUE SHIELD | Admitting: Urology

## 2019-04-16 ENCOUNTER — Ambulatory Visit (INDEPENDENT_AMBULATORY_CARE_PROVIDER_SITE_OTHER): Payer: BC Managed Care – PPO | Admitting: Urology

## 2019-04-16 ENCOUNTER — Other Ambulatory Visit: Payer: Self-pay

## 2019-04-16 ENCOUNTER — Encounter: Payer: Self-pay | Admitting: Urology

## 2019-04-16 VITALS — BP 165/89 | HR 91 | Ht 74.0 in | Wt >= 6400 oz

## 2019-04-16 DIAGNOSIS — C61 Malignant neoplasm of prostate: Secondary | ICD-10-CM | POA: Diagnosis not present

## 2019-04-16 NOTE — Progress Notes (Signed)
   04/16/2019 2:47 PM   David Rangel September 17, 1970 [id number]  Reason for visit: Follow up active surveillance for very low risk prostate cancer  HPI: Briefly, he is a 48 year old morbidly obese African-American male with a strong family history of prostate cancer who was diagnosed with a single core of Gleason score 3+3 = 6 prostate cancer all the way back in 2008 by Dr. Yves Dill.  He was then lost to follow-up.  His PSA had risen to 7.5 recently and he underwent re-biopsy on 03/27/2018.  This showed a single core of Gleason score 3+3=6 disease with 5% max core involvement.  He is in the very low risk category.  Unfortunately, he developed fever to 102 after his most recent prostate biopsy in August 2018.  He was hospitalized and treated with IV antibiotics, and discharged on 2 weeks of culture appropriate Bactrim for E. coli UTI.  He denies any problems over the last year.  He did not have a PSA drawn prior to today's visit.  We have deferred DRE's with his morbid obesity and inability to palpate the prostate.  We had a long discussion about his very low risk prostate cancer and the need for ongoing PSA monitoring.  Will obtain PSA today, call with results.  If PSA continues to rise would consider prostate MRI if PSA greater than 10, otherwise would continue yearly PSA monitoring  PSA today, will call with results  A total of 15 minutes were spent face-to-face with the patient, greater than 50% was spent in patient education, counseling, and coordination of care regarding low risk prostate cancer and active surveillance.  [name], [facility] [address], [place], [place] [address] [phone]

## 2019-04-17 ENCOUNTER — Telehealth: Payer: Self-pay

## 2019-04-17 DIAGNOSIS — Z8546 Personal history of malignant neoplasm of prostate: Secondary | ICD-10-CM

## 2019-04-17 LAB — PSA: Prostate Specific Ag, Serum: 7.1 ng/mL — ABNORMAL HIGH (ref 0.0–4.0)

## 2019-04-17 NOTE — Telephone Encounter (Signed)
-----   Message from [name], MD sent at 04/17/2019  8:15 AM EDT ----- PSA down to 7.1 from 7.5, great news.   RTC one year with PSA prior  Nickolas Madrid, MD 04/17/2019

## 2019-04-17 NOTE — Telephone Encounter (Signed)
Called pt informed him of the information below. Pt gave verbal understanding. PSA ordered. Appt scheduled.  ?

## 2019-04-25 DIAGNOSIS — G4733 Obstructive sleep apnea (adult) (pediatric): Secondary | ICD-10-CM | POA: Diagnosis not present

## 2019-06-24 DIAGNOSIS — G4733 Obstructive sleep apnea (adult) (pediatric): Secondary | ICD-10-CM | POA: Diagnosis not present

## 2019-06-24 DIAGNOSIS — I119 Hypertensive heart disease without heart failure: Secondary | ICD-10-CM | POA: Diagnosis not present

## 2019-06-24 DIAGNOSIS — L293 Anogenital pruritus, unspecified: Secondary | ICD-10-CM | POA: Diagnosis not present

## 2019-07-29 DIAGNOSIS — G4733 Obstructive sleep apnea (adult) (pediatric): Secondary | ICD-10-CM | POA: Diagnosis not present

## 2020-01-19 ENCOUNTER — Other Ambulatory Visit: Payer: Self-pay

## 2020-01-19 ENCOUNTER — Encounter: Payer: Self-pay | Admitting: Internal Medicine

## 2020-01-19 ENCOUNTER — Ambulatory Visit (INDEPENDENT_AMBULATORY_CARE_PROVIDER_SITE_OTHER): Payer: Self-pay | Admitting: Internal Medicine

## 2020-01-19 VITALS — BP 155/89 | HR 70 | Ht 74.0 in | Wt >= 6400 oz

## 2020-01-19 DIAGNOSIS — Z9989 Dependence on other enabling machines and devices: Secondary | ICD-10-CM | POA: Diagnosis not present

## 2020-01-19 DIAGNOSIS — G4733 Obstructive sleep apnea (adult) (pediatric): Secondary | ICD-10-CM

## 2020-01-19 DIAGNOSIS — M7041 Prepatellar bursitis, right knee: Secondary | ICD-10-CM | POA: Insufficient documentation

## 2020-01-19 DIAGNOSIS — Z716 Tobacco abuse counseling: Secondary | ICD-10-CM | POA: Insufficient documentation

## 2020-01-19 DIAGNOSIS — Z9884 Bariatric surgery status: Secondary | ICD-10-CM

## 2020-01-19 DIAGNOSIS — M7042 Prepatellar bursitis, left knee: Secondary | ICD-10-CM

## 2020-01-19 DIAGNOSIS — C61 Malignant neoplasm of prostate: Secondary | ICD-10-CM

## 2020-01-19 DIAGNOSIS — I10 Essential (primary) hypertension: Secondary | ICD-10-CM

## 2020-01-19 NOTE — Assessment & Plan Note (Signed)
Patient is using his CPAP machine on a regular basis.

## 2020-01-19 NOTE — Assessment & Plan Note (Signed)
Patient blood pressure was elevated today he said he is taking his medicine regularly he was advised not to use ibuprofen try to lose weight.

## 2020-01-19 NOTE — Assessment & Plan Note (Signed)
Patient was advised to revisit his surgeon who did his bypass surgery whether he can have further option to let him lose some more weight.

## 2020-01-19 NOTE — Assessment & Plan Note (Signed)
Prostate cancer is stable.  He is being followed up regularly by urologist.

## 2020-01-19 NOTE — Assessment & Plan Note (Signed)
Patient continues to smoke cigar I told him to quit smoking completely.

## 2020-01-19 NOTE — Patient Instructions (Addendum)
He will have blood work done today. He was informed today about receiving a colonoscopy. He will be referred to orthopedic surgery for evaluation of his left pinkie pain and bilateral knee pains. He is contemplating getting another surgery with bariatric, perhaps a sleeve  procedure

## 2020-01-19 NOTE — Progress Notes (Signed)
Established Patient Office Visit  SUBJECTIVE:  Subjective  Patient ID: David Rangel, male    DOB: [birth year]  Age: 49 y.o. MRN: [id number]  CC:  Chief Complaint  Patient presents with  . Hypertension    3 month follow up    HPI David Rangel is a 49 y.o. male presenting today for 1-month evaluation of hypertension.  He complains of aching in his bilateral knee joints. He also complains of constant pain in his left pinkie finger after jamming it into the stairwell railing at home 2 months ago.  He is taking all of his medications and is tolerating them well. He uses an albuterol inhaler for his asthma as needed. He smokes 3 cigars per week, though he quit smoking cigarettes 7 years ago. He drinks on Saturdays and Sundays, 1-2 beers or mixed drinks per day, and denies getting drunk.   He has not had a colonoscopy yet. He uses a CPAP for his sleep apnea, with a max of 19. He is being followed by Dr. Nickolas Madrid for his prostate cancer and will see him next in August.  Past Medical History:  Diagnosis Date  . Asthma   . Elevated PSA   . Hypertension   . Prostate cancer ([place]) 2019  . Sleep apnea     Past Surgical History:  Procedure Laterality Date  . GASTRIC BYPASS  2009  . PROSTATE BIOPSY  2019   ?postitive    Family History  Problem Relation Age of Onset  . Prostate cancer Paternal Uncle   . Prostate cancer Maternal Uncle   . Prostate cancer Father   . Prostate cancer Paternal Grandfather   . Bladder Cancer Neg Hx   . Kidney cancer Neg Hx     Social History   Socioeconomic History  . Marital status: Married    Spouse name: Not on file  . Number of children: Not on file  . Years of education: Not on file  . Highest education level: Not on file  Occupational History  . Not on file  Tobacco Use  . Smoking status: Current Some Day Smoker    Types: Cigars  . Smokeless tobacco: Never Used  . Tobacco comment: quit in 09/2015  Vaping Use  . Vaping Use:  Never used  Substance and Sexual Activity  . Alcohol use: Yes    Alcohol/week: 2.0 standard drinks    Types: 2 Cans of beer per week  . Drug use: No  . Sexual activity: Yes    Birth control/protection: None  Other Topics Concern  . Not on file  Social History Narrative  . Not on file   Social Determinants of Health   Financial Resource Strain:   . Difficulty of Paying Living Expenses:   Food Insecurity:   . Worried About Charity fundraiser in the Last Year:   . Arboriculturist in the Last Year:   Transportation Needs:   . Film/video editor (Medical):   Marland Kitchen Lack of Transportation (Non-Medical):   Physical Activity:   . Days of Exercise per Week:   . Minutes of Exercise per Session:   Stress:   . Feeling of Stress :   Social Connections:   . Frequency of Communication with Friends and Family:   . Frequency of Social Gatherings with Friends and Family:   . Attends Religious Services:   . Active Member of Clubs or Organizations:   . Attends Archivist Meetings:   Marland Kitchen Marital  Status:   Intimate Partner Violence:   . Fear of Current or Ex-Partner:   . Emotionally Abused:   Marland Kitchen Physically Abused:   . Sexually Abused:      Current Outpatient Medications:  .  albuterol (PROVENTIL HFA;VENTOLIN HFA) 108 (90 Base) MCG/ACT inhaler, Inhale 1-2 puffs into the lungs every 6 (six) hours as needed for wheezing or shortness of breath., Disp: , Rfl:  .  amLODipine (NORVASC) 10 MG tablet, Take 10 mg by mouth daily., Disp: , Rfl: 5 .  hydrochlorothiazide (HYDRODIURIL) 25 MG tablet, Take 25 mg by mouth daily., Disp: , Rfl:  .  losartan (COZAAR) 100 MG tablet, Take 200 mg by mouth daily., Disp: , Rfl:  .  Multiple Vitamin (MULTIVITAMIN WITH MINERALS) TABS tablet, Take 1 tablet by mouth daily., Disp: , Rfl:  .  tadalafil (ADCIRCA/CIALIS) 20 MG tablet, Take 20 mg by mouth once a week., Disp: , Rfl: 4   Allergies  Allergen Reactions  . Cashew Nut Oil Anaphylaxis    ROS Review  of Systems  Constitutional: Negative.  Negative for unexpected weight change.  HENT: Negative.  Negative for trouble swallowing.   Eyes: Negative.   Respiratory: Negative.   Cardiovascular: Negative.   Gastrointestinal: Negative.   Endocrine: Negative.   Genitourinary: Negative.   Musculoskeletal: Positive for arthralgias (left pinkie joint; bilat knees).  Skin: Negative.   Allergic/Immunologic: Negative.   Neurological: Negative.   Hematological: Negative.   Psychiatric/Behavioral: Negative.   All other systems reviewed and are negative.    OBJECTIVE:    Physical Exam Vitals reviewed.  Constitutional:      Appearance: Normal appearance. He is obese.  Neck:     Vascular: No carotid bruit.  Cardiovascular:     Rate and Rhythm: Normal rate and regular rhythm.     Pulses: Normal pulses.     Heart sounds: Normal heart sounds.  Pulmonary:     Effort: Pulmonary effort is normal.     Breath sounds: Normal breath sounds.  Abdominal:     General: Bowel sounds are normal.     Palpations: Abdomen is soft. There is no hepatomegaly or splenomegaly.     Tenderness: There is no abdominal tenderness.     Hernia: No hernia is present.  Musculoskeletal:     Left hand: Bony tenderness (5th metacarpal-phalangeal joint) present.     Right lower leg: No edema.     Left lower leg: No edema.  Skin:    Findings: No rash.  Neurological:     Mental Status: He is alert and oriented to person, place, and time.  Psychiatric:        Mood and Affect: Mood normal.        Behavior: Behavior normal.     BP (!) 155/89   Pulse 70   Ht 6\' 2"  (1.88 m)   Wt (!) 424 lb 6.4 oz (192.5 kg)   BMI 54.49 kg/m  Wt Readings from Last 3 Encounters:  01/19/20 (!) 424 lb 6.4 oz (192.5 kg)  04/16/19 (!) 428 lb (194.1 kg)  04/23/18 (!) 408 lb (185.1 kg)    Health Maintenance Due  Topic Date Due  . Hepatitis C Screening  Never done  . COVID-19 Vaccine (1) Never done  . TETANUS/TDAP  Never done  .  COLONOSCOPY  Never done    There are no preventive care reminders to display for this patient.  CBC Latest Ref Rng & Units 04/25/2018 04/24/2018 04/24/2018  WBC 4.0 - 10.5  K/uL 9.5 17.0(H) 19.2(H)  Hemoglobin 13.0 - 17.0 g/dL 12.2(L) 11.3(L) 12.7(L)  Hematocrit 39 - 52 % 37.3(L) 34.0(L) 38.5(L)  Platelets 150 - 400 K/uL 277 269 292   CMP Latest Ref Rng & Units 04/25/2018 04/24/2018 04/24/2018  Glucose 70 - 99 mg/dL 103(H) 108(H) 102(H)  BUN 6 - 20 mg/dL 10 11 13   Creatinine 0.61 - 1.24 mg/dL 0.99 1.21 1.23  Sodium 135 - 145 mmol/L 140 138 142  Potassium 3.5 - 5.1 mmol/L 3.8 3.2(L) 3.6  Chloride 98 - 111 mmol/L 103 107 104  CO2 22 - 32 mmol/L 27 25 29   Calcium 8.9 - 10.3 mg/dL 8.6(L) 8.1(L) 9.0  Total Protein 6.5 - 8.1 g/dL - - 7.4  Total Bilirubin 0.3 - 1.2 mg/dL - - 1.0  Alkaline Phos 38 - 126 U/L - - 56  AST 15 - 41 U/L - - 18  ALT 0 - 44 U/L - - 17    No results found for: TSH Lab Results  Component Value Date   ALBUMIN 3.6 04/24/2018   ANIONGAP 10 04/25/2018   No results found for: CHOL, HDL, LDLCALC, CHOLHDL No results found for: TRIG No results found for: HGBA1C    ASSESSMENT & PLAN:   Problem List Items Addressed This Visit      Cardiovascular and Mediastinum   HTN (hypertension) - Primary    Patient blood pressure was elevated today he said he is taking his medicine regularly he was advised not to use ibuprofen try to lose weight.        Respiratory   OSA on CPAP    Patient is using his CPAP machine on a regular basis.        Musculoskeletal and Integument   Prepatellar bursitis of both knees    Patient will be referred to orthopedic surgeon for evaluation of right knee left knee and left middle finger sprain. no x-ray has been done on the knee        Genitourinary   Prostate cancer [facility])    Prostate cancer is stable.  He is being followed up regularly by urologist.        Other   Status post gastric bypass for obesity    Patient was advised to  revisit his surgeon who did his bypass surgery whether he can have further option to let him lose some more weight.      Tobacco abuse counseling    Patient continues to smoke cigar I told him to quit smoking completely.         No orders of the defined types were placed in this encounter.    1. Essential hypertension   2. OSA on CPAP   3. Prostate cancer ([place])   4. Status post gastric bypass for obesity   5. Prepatellar bursitis of both knees   6. Tobacco abuse counseling  Follow-up: Return in about 4 weeks (around 02/16/2020).   colonoscopy  Ref to biasteric surgeon/ ref to orthoe  Dr. Jane Canary [facility] [address], [place], [place] [address]   By signing my name below, I, Milinda Antis, attest that this documentation has been prepared under the direction and in the presence of Cletis Athens, MD. Electronically Signed: Cletis Athens, MD 01/19/20, 1:56 PM   I personally performed the services described in this documentation, which was SCRIBED in my presence. The recorded information has been reviewed and considered accurate. It has been edited as necessary during review. Cletis Athens, MD

## 2020-01-19 NOTE — Assessment & Plan Note (Signed)
Patient will be referred to orthopedic surgeon for evaluation of right knee left knee and left middle finger sprain. no x-ray has been done on the knee

## 2020-01-19 NOTE — Addendum Note (Signed)
Addended by: Alois Cliche on: 01/19/2020 02:27 PM   Modules accepted: Orders

## 2020-01-20 LAB — COMPLETE METABOLIC PANEL WITH GFR
AG Ratio: 1.3 (calc) (ref 1.0–2.5)
ALT: 18 U/L (ref 9–46)
AST: 19 U/L (ref 10–35)
Albumin: 4.2 g/dL (ref 3.6–5.1)
Alkaline phosphatase (APISO): 76 U/L (ref 35–144)
BUN: 11 mg/dL (ref 7–25)
CO2: 20 mmol/L (ref 20–32)
Calcium: 9.5 mg/dL (ref 8.6–10.3)
Chloride: 107 mmol/L (ref 98–110)
Creat: 1.19 mg/dL (ref 0.70–1.33)
GFR, Est African American: 82 mL/min/{1.73_m2} (ref >=60)
GFR, Est Non African American: 71 mL/min/{1.73_m2} (ref >=60)
Globulin: 3.2 g/dL (calc) (ref 1.9–3.7)
Glucose, Bld: 79 mg/dL (ref 65–99)
Potassium: 4.9 mmol/L (ref 3.5–5.3)
Sodium: 141 mmol/L (ref 135–146)
Total Bilirubin: 0.4 mg/dL (ref 0.2–1.2)
Total Protein: 7.4 g/dL (ref 6.1–8.1)

## 2020-01-20 LAB — CBC WITH DIFFERENTIAL/PLATELET
Absolute Monocytes: 747 cells/uL (ref 200–950)
Basophils Absolute: 48 cells/uL (ref 0–200)
Basophils Relative: 0.9 %
Eosinophils Absolute: 217 cells/uL (ref 15–500)
Eosinophils Relative: 4.1 %
HCT: 42.6 % (ref 38.5–50.0)
Hemoglobin: 14.3 g/dL (ref 13.2–17.1)
Lymphs Abs: 1966 cells/uL (ref 850–3900)
MCH: 27.9 pg (ref 27.0–33.0)
MCHC: 33.6 g/dL (ref 32.0–36.0)
MCV: 83.2 fL (ref 80.0–100.0)
MPV: 11.7 fL (ref 7.5–12.5)
Monocytes Relative: 14.1 %
Neutro Abs: 2321 cells/uL (ref 1500–7800)
Neutrophils Relative %: 43.8 %
Platelets: 189 10*3/uL (ref 140–400)
RBC: 5.12 10*6/uL (ref 4.20–5.80)
RDW: 14.3 % (ref 11.0–15.0)
Total Lymphocyte: 37.1 %
WBC: 5.3 10*3/uL (ref 3.8–10.8)

## 2020-01-20 LAB — TSH: TSH: 1.14 mIU/L (ref 0.40–4.50)

## 2020-01-23 ENCOUNTER — Ambulatory Visit (INDEPENDENT_AMBULATORY_CARE_PROVIDER_SITE_OTHER): Payer: BC Managed Care – PPO | Admitting: Internal Medicine

## 2020-01-23 ENCOUNTER — Encounter: Payer: Self-pay | Admitting: Internal Medicine

## 2020-01-23 ENCOUNTER — Other Ambulatory Visit: Payer: Self-pay

## 2020-01-23 VITALS — BP 167/85 | HR 70 | Ht 74.0 in | Wt >= 6400 oz

## 2020-01-23 DIAGNOSIS — Z716 Tobacco abuse counseling: Secondary | ICD-10-CM | POA: Diagnosis not present

## 2020-01-23 DIAGNOSIS — C61 Malignant neoplasm of prostate: Secondary | ICD-10-CM

## 2020-01-23 DIAGNOSIS — G4733 Obstructive sleep apnea (adult) (pediatric): Secondary | ICD-10-CM

## 2020-01-23 DIAGNOSIS — M159 Polyosteoarthritis, unspecified: Secondary | ICD-10-CM

## 2020-01-23 DIAGNOSIS — Z6841 Body Mass Index (BMI) 40.0 and over, adult: Secondary | ICD-10-CM

## 2020-01-23 DIAGNOSIS — Z9989 Dependence on other enabling machines and devices: Secondary | ICD-10-CM

## 2020-01-23 DIAGNOSIS — Z9884 Bariatric surgery status: Secondary | ICD-10-CM

## 2020-01-23 NOTE — Patient Instructions (Signed)
The [facility] Diet: A weight-loss program for life The [facility] Diet is a lifestyle approach to weight loss that can help you maintain a healthy weight for a lifetime. By [facility] Staff  Definition The [place] is a long-term weight management program created by a team of weight-loss experts at [facility]. The [facility] Diet is designed to help you reshape your lifestyle by adopting healthy new habits and breaking unhealthy old ones. The goal is to make simple, pleasurable changes that will result in a healthy weight that you can maintain for the rest of your life.  Purpose The purpose of the [place] is to help you lose excess weight and to find a way of eating that you can sustain for a lifetime. It focuses on changing your daily routine by adding and breaking habits that can make a difference in your weight, such as eating more fruits and vegetables, not eating while you watch TV, and moving your body for 30 minutes a day.  The [facility] Diet also stresses key components of behavior change, such as finding your inner motivation to lose weight, setting achievable goals and handling setbacks.  Why you might follow the [place] might choose to follow the [place] because you: . Want to follow a diet that has been developed by medical professionals . Enjoy the types and amounts of food featured in the diet, including unlimited vegetables and fruits . Want to learn how to drop unhealthy lifestyle habits and gain healthy ones . Want to improve your health and reduce your health risks by becoming more active and eating the recommended foods . Don't want to be precise about counting calories or grams of fat or eliminate entire groups of foods . Want a diet you can stick with for life, not a fad or quick fix . Check with your doctor or health care provider before starting any weight-loss diet, especially if you have any health conditions.    Diet  Details  The [place] is the official diet developed by [facility], based on research and clinical experience. It focuses on eating healthy foods that taste great and increasing physical activity. It emphasizes that the best way to keep weight off for good is to change your lifestyle and adopt new health habits. This diet can be tailored to your own individual needs and health history -- it isn't a one-size-fits-all approach.  The [facility] Diet has two main parts:  1. Lose It! This two-week phase is designed to jump-start your weight loss, so you may lose up to 6 to 10 pounds (2.7 to 4.5 kilograms) in a safe and healthy way. In this phase, you focus on lifestyle habits that are associated with weight. You learn how to add five healthy habits, break five unhealthy habits and adopt another five bonus healthy habits. This phase can help you see some quick results -- a psychological boost -- and start practicing important habits that you'll carry into the next phase of the diet. 2. Live It! This phase is a lifelong approach to diet and health. In this phase, you learn more about food choices, portion sizes, menu planning, physical activity, exercise and sticking to healthy habits. You may continue to see a steady weight loss of 1 to 2 pounds (0.5 to 1 kilogram) a week until you reach your goal weight. This phase can also help you maintain your goal weight permanently.  Follow  the [place] Weight Pyramid The [facility] Diet doesn't require you to be precise about counting calories or grams of fat. Instead, the [facility] Weight Pyramid serves as a guide to making smart eating choices. The main message is simple: Eat most of your food from the groups at the base of the pyramid and less from the top -- and move more.  Eat healthy foods and portions The base of the [place] Weight Pyramid focuses on generous amounts of healthy foods that contain a smaller number of  calories in a large volume of food, particularly fruits and vegetables.  This principle involves eating low-energy-dense foods and can help you lose weight by feeling full on fewer calories. Healthy choices in each of the other food groups in moderate amounts make up the rest of the pyramid -- including whole-grain carbohydrates, lean sources of protein such as legumes, fish and low-fat dairy, and heart-healthy unsaturated fats.  The [facility] Diet teaches you how to estimate portion sizes and plan meals. The diet doesn't require you to eliminate any foods.  Increase your physical activity The [facility] Diet provides practical and realistic ideas for including more physical activity and exercise throughout your day -- as well as finding a plan that works for you. The diet recommends getting at least 30 minutes of exercise every day and even more exercise for further health benefits and weight loss. The diet also emphasizes moving more throughout the day, such as taking the stairs instead of an elevator.  If you've been inactive or you have a medical condition, talk to your doctor or health care provider before starting a new physical activity program. Most people can begin with five- or 10-minute activity sessions and increase the time gradually.  Typical menu for the [place] provides several calorie levels. Here's a look at a typical daily meal plan at the 1,200-calorie-a-day level:  . Breakfast: 1/2 cup cooked oatmeal with 1 cup milk and 2 tablespoons raisins, 1/4 cup mango, calorie-free beverage . Lunch: Quinoa and sweet potato cakes, tossed salad with fat-free dressing, calorie-free beverage . Dinner: 1 pita pizza, 3/4 cup mixed fruit, calorie-free beverage . Snack: 1 cup sliced bell peppers and 2 tablespoons hummus You can have sweets -- as long as you limit them to 75 calories a day. For practicality, consider thinking of your sweets calories over the course  of a week. Have low-fat frozen yogurt or dark chocolate on Monday, and then hold off on any more sweets for a few days.  Results  Weight loss The [facility] Diet is designed to help you lose up to 6 to 10 pounds (2.7 to 4.5 kilograms) during the initial two-week phase. After that, you transition into the second phase, where you continue to lose 1 to 2 pounds (0.5 to 1 kilogram) a week until you reach your goal weight. By continuing the lifelong habits that you've learned, you can then maintain your goal weight for the rest of your life.  Most people can lose weight on almost any diet plan that restricts calories -- at least in the short term. The goal of the [place] is to help you keep weight off permanently by making smarter food choices, learning how to manage setbacks and changing your lifestyle.  Other health benefits In general, losing weight by following a healthy, nutritious diet -- such as the [place] -- can reduce your risk of weight-related  health problems, such as diabetes, heart disease, high blood pressure and sleep apnea. If you already have any of these conditions, they may be improved dramatically if you lose weight, regardless of the diet plan you follow.  In addition, the healthy habits and kinds of foods recommended on the [place] -- including lots of vegetables, fruits, whole grains, nuts, beans, fish and healthy fats -- can further reduce your risk of certain health conditions. The [facility] Diet is meant to be positive, practical, sustainable and enjoyable, so you can enjoy a happier, healthier life over the long term.  Risks  The [facility] Diet is generally safe for most adults. It does encourage unlimited amounts of vegetables and fruits. For most people, eating lots of fruits and vegetables is a good thing -- these foods provide your body with important nutrients and fiber. However, if you aren't used to having fiber in your diet, you may  experience minor, temporary changes in digestion, such as intestinal gas, as your body adjusts to this new way of eating.  Also, the natural sugar in fruit does affect your carbohydrate intake -- especially if you eat a lot of fruit. This may temporarily raise your blood sugar or certain blood fats. However, this effect is lessened if you are losing weight. If you have diabetes or any other health conditions or concerns, work with your doctor to adjust the [place] for your situation. For example, people with diabetes should aim for more vegetables than fruits, if possible. It's a good idea to snack on vegetables, rather than snacking only on fruit.    Sample menu under the [place] Weight Pyramid  This sample menu for a day follows the [facility] Weight Pyramid. The sample shows you how to fit in servings from each category in the pyramid without going over 1,200 calories. You can adjust the menu to suit your own tastes and calorie needs.  Sample menu (1,200 calories)  Breakfast . 3/4 cup hot whole-grain cereal . 1 small banana . Calorie-free beverage  Lunch . Tossed salad (2 cups romaine, 1/4 onion, 1/4 cup mushrooms, 1 medium tomato, 1 hard-boiled egg, 1/2 cup low-fat shredded cheddar cheese) . 1 whole-wheat dinner roll . 1 1/2 teaspoons butter . 1/2 cup cubed pineapple . Calorie-free beverage  Dinner . 3 ounces seared scallops in 1 teaspoon olive oil . Garlic mashed cauliflower potatoes . 1/2 cup beets . Calorie-free beverage  Snack (any time) . 2 plums . 8 wheat crackers   Nutrient analysis and servings for the sample menu  Calories   1,178 Total fat   34 g Saturated fat   12 g Monounsaturated fat  11 g Cholesterol   256 mg Sodium   1,761 mg Total carbohydrate  157 g Dietary fiber   24 g Trans fat   Trace Total sugar   58 g Added sugar   0 g Protein    61 g  [facility] Healthy Weight Pyramid servings with the sample  menu Vegetables   4 Fruits    3 Carbohydrates  3 1/2 Protein and dairy  3 Fats    3 Sweets   0   Healthy snacking to help fill you up  If you feel hungry while following the [facility] Weight Pyramid, reach for more fruits and vegetables to snack on. Just make sure your fruit is either fresh or canned in water or juice -- and that you pour off the  liquid before eating.   More information can be found at [facility].org. Information provided from:  https://www.mayoclinic.org/healthy-lifestyle/weight-loss/in-depth/[facility]-diet/art-20045460 https://www.mayoclinic.org/healthy-lifestyle/nutrition-and-healthy-eating/in-depth/healthy-weight-pyramid/art-20045416  

## 2020-01-23 NOTE — Assessment & Plan Note (Signed)
PSA will be repeated next visit by his urologist.

## 2020-01-23 NOTE — Assessment & Plan Note (Signed)
Patient was advised to quit smoking 

## 2020-01-23 NOTE — Progress Notes (Signed)
Established Patient Office Visit  SUBJECTIVE:  Subjective  Patient ID: David Rangel, male    DOB: 11/18/1970  Age: 49 y.o. MRN: [id number]  CC:  Chief Complaint  Patient presents with  . Lab Results    HPI David Rangel is a 49 y.o. male presenting today for a review of his recent laboratory results.   He states that his knees continue to ache, and are hurting him today in particularly.   He is nervous about getting the COVID-19 vaccine due to the potential for heart inflammation, but I have encouraged him to get the vaccine.  The patient notes that he jammed his  left hand several weeks ago and he is extremely tender to the touch.   Past Medical History:  Diagnosis Date  . Asthma   . Elevated PSA   . Hypertension   . Prostate cancer ([place]) 2019  . Sleep apnea     Past Surgical History:  Procedure Laterality Date  . GASTRIC BYPASS  2009  . PROSTATE BIOPSY  2019   ?postitive    Family History  Problem Relation Age of Onset  . Prostate cancer Paternal Uncle   . Prostate cancer Maternal Uncle   . Prostate cancer Father   . Prostate cancer Paternal Grandfather   . Bladder Cancer Neg Hx   . Kidney cancer Neg Hx     Social History   Socioeconomic History  . Marital status: Married    Spouse name: Not on file  . Number of children: Not on file  . Years of education: Not on file  . Highest education level: Not on file  Occupational History  . Not on file  Tobacco Use  . Smoking status: Current Some Day Smoker    Types: Cigars  . Smokeless tobacco: Never Used  . Tobacco comment: quit in 09/2015  Vaping Use  . Vaping Use: Never used  Substance and Sexual Activity  . Alcohol use: Yes    Alcohol/week: 2.0 standard drinks    Types: 2 Cans of beer per week  . Drug use: No  . Sexual activity: Yes    Birth control/protection: None  Other Topics Concern  . Not on file  Social History Narrative  . Not on file   Social Determinants of Health   Financial  Resource Strain:   . Difficulty of Paying Living Expenses:   Food Insecurity:   . Worried About Charity fundraiser in the Last Year:   . Arboriculturist in the Last Year:   Transportation Needs:   . Film/video editor (Medical):   Marland Kitchen Lack of Transportation (Non-Medical):   Physical Activity:   . Days of Exercise per Week:   . Minutes of Exercise per Session:   Stress:   . Feeling of Stress :   Social Connections:   . Frequency of Communication with Friends and Family:   . Frequency of Social Gatherings with Friends and Family:   . Attends Religious Services:   . Active Member of Clubs or Organizations:   . Attends Archivist Meetings:   Marland Kitchen Marital Status:   Intimate Partner Violence:   . Fear of Current or Ex-Partner:   . Emotionally Abused:   Marland Kitchen Physically Abused:   . Sexually Abused:      Current Outpatient Medications:  .  albuterol (PROVENTIL HFA;VENTOLIN HFA) 108 (90 Base) MCG/ACT inhaler, Inhale 1-2 puffs into the lungs every 6 (six) hours as needed for wheezing or shortness  of breath., Disp: , Rfl:  .  amLODipine (NORVASC) 10 MG tablet, Take 10 mg by mouth daily., Disp: , Rfl: 5 .  hydrochlorothiazide (HYDRODIURIL) 25 MG tablet, Take 25 mg by mouth daily., Disp: , Rfl:  .  losartan (COZAAR) 100 MG tablet, Take 200 mg by mouth daily., Disp: , Rfl:  .  Multiple Vitamin (MULTIVITAMIN WITH MINERALS) TABS tablet, Take 1 tablet by mouth daily., Disp: , Rfl:  .  tadalafil (ADCIRCA/CIALIS) 20 MG tablet, Take 20 mg by mouth once a week., Disp: , Rfl: 4   Allergies  Allergen Reactions  . Cashew Nut Oil Anaphylaxis    ROS Review of Systems  Constitutional: Negative.   HENT: Negative.   Eyes: Negative.   Respiratory: Negative.   Cardiovascular: Negative.   Gastrointestinal: Negative.   Endocrine: Negative.   Genitourinary: Negative.   Musculoskeletal: Positive for arthralgias (Bilateral knees).  Skin: Negative.   Allergic/Immunologic: Negative.     Neurological: Negative.   Hematological: Negative.   Psychiatric/Behavioral: Negative.   All other systems reviewed and are negative.    OBJECTIVE:    Physical Exam Vitals reviewed.  Constitutional:      Appearance: Normal appearance.  HENT:     Nose: Nose normal.     Mouth/Throat:     Mouth: Mucous membranes are moist.  Eyes:     Pupils: Pupils are equal, round, and reactive to light.  Neck:     Vascular: No carotid bruit.  Cardiovascular:     Rate and Rhythm: Normal rate and regular rhythm.     Pulses: Normal pulses.     Heart sounds: Normal heart sounds.  Pulmonary:     Effort: Pulmonary effort is normal.     Breath sounds: Normal breath sounds.  Abdominal:     General: Bowel sounds are normal.     Palpations: Abdomen is soft. There is no hepatomegaly or splenomegaly.     Tenderness: There is no abdominal tenderness.     Hernia: No hernia is present.  Musculoskeletal:     Right hand: No bony tenderness.     Left hand: Bony tenderness present.     Right knee: Bony tenderness present.     Left knee: Bony tenderness present.     Right lower leg: No edema.     Left lower leg: No edema.  Skin:    Findings: No rash.  Neurological:     Mental Status: He is alert and oriented to person, place, and time.  Psychiatric:        Mood and Affect: Mood normal.        Behavior: Behavior normal.     BP (!) 167/85   Pulse 70   Ht 6\' 2"  (1.88 m)   Wt (!) 429 lb 9.6 oz (194.9 kg)   BMI 55.16 kg/m  Wt Readings from Last 3 Encounters:  01/23/20 (!) 429 lb 9.6 oz (194.9 kg)  01/19/20 (!) 424 lb 6.4 oz (192.5 kg)  04/16/19 (!) 428 lb (194.1 kg)    Health Maintenance Due  Topic Date Due  . Hepatitis C Screening  Never done  . COVID-19 Vaccine (1) Never done  . TETANUS/TDAP  Never done  . COLONOSCOPY  Never done    There are no preventive care reminders to display for this patient.  CBC Latest Ref Rng & Units 01/19/2020 04/25/2018 04/24/2018  WBC 3.8 - 10.8 Thousand/uL  5.3 9.5 17.0(H)  Hemoglobin 13.2 - 17.1 g/dL 14.3 12.2(L) 11.3(L)  Hematocrit 38 -  50 % 42.6 37.3(L) 34.0(L)  Platelets 140 - 400 Thousand/uL 189 277 269   CMP Latest Ref Rng & Units 01/19/2020 04/25/2018 04/24/2018  Glucose 65 - 99 mg/dL 79 103(H) 108(H)  BUN 7 - 25 mg/dL 11 10 11   Creatinine 0.70 - 1.33 mg/dL 1.19 0.99 1.21  Sodium 135 - 146 mmol/L 141 140 138  Potassium 3.5 - 5.3 mmol/L 4.9 3.8 3.2(L)  Chloride 98 - 110 mmol/L 107 103 107  CO2 20 - 32 mmol/L 20 27 25   Calcium 8.6 - 10.3 mg/dL 9.5 8.6(L) 8.1(L)  Total Protein 6.1 - 8.1 g/dL 7.4 - -  Total Bilirubin 0.2 - 1.2 mg/dL 0.4 - -  Alkaline Phos 38 - 126 U/L - - -  AST 10 - 35 U/L 19 - -  ALT 9 - 46 U/L 18 - -    Lab Results  Component Value Date   TSH 1.14 01/19/2020   Lab Results  Component Value Date   ALBUMIN 3.6 04/24/2018   ANIONGAP 10 04/25/2018   No results found for: CHOL, HDL, LDLCALC, CHOLHDL No results found for: TRIG No results found for: HGBA1C    ASSESSMENT & PLAN:   Problem List Items Addressed This Visit      Respiratory   OSA on CPAP    Patient is compliant with CPAP machine.        Genitourinary   Prostate cancer [facility])    PSA will be repeated next visit by his urologist.        Other   Morbid obesity with BMI of 50.0-59.9, adult ([place])   Status post gastric bypass for obesity    Loss of weight was discussed again.      Tobacco abuse counseling    Patient was advised to quit smoking.       Other Visit Diagnoses    Osteoarthritis of multiple joints, unspecified osteoarthritis type    -  Primary   Relevant Orders   Ambulatory referral to Orthopedic Surgery      No orders of the defined types were placed in this encounter.  Labs  Discussed  With  patient  Osteoarthritis of multiple joints, unspecified osteoarthritis type - Plan: Ambulatory referral to Orthopedic Surgery  Morbid obesity with BMI of 50.0-59.9, adult ([place])  Prostate cancer ([place])  Tobacco abuse  counseling  OSA on CPAP  Status post gastric bypass for obesity  Follow-up: Return in about 3 months (around 04/24/2020).    Dr. Jane Canary [facility] [address], [place], [place] [address]   By signing my name below, I, [employer], attest that this documentation has been prepared under the direction and in the presence of Cletis Athens, MD. Electronically Signed: Cletis Athens, MD 01/23/20, 12:21 PM   I personally performed the services described in this documentation, which was SCRIBED in my presence. The recorded information has been reviewed and considered accurate. It has been edited as necessary during review. Cletis Athens, MD

## 2020-01-23 NOTE — Assessment & Plan Note (Signed)
Loss of weight was discussed again.

## 2020-01-23 NOTE — Assessment & Plan Note (Signed)
Patient is compliant with CPAP machine 

## 2020-02-05 ENCOUNTER — Ambulatory Visit: Payer: Self-pay

## 2020-02-05 ENCOUNTER — Encounter: Payer: Self-pay | Admitting: Orthopaedic Surgery

## 2020-02-05 ENCOUNTER — Ambulatory Visit (INDEPENDENT_AMBULATORY_CARE_PROVIDER_SITE_OTHER): Payer: BC Managed Care – PPO | Admitting: Orthopaedic Surgery

## 2020-02-05 VITALS — Ht 73.0 in | Wt >= 6400 oz

## 2020-02-05 DIAGNOSIS — M79645 Pain in left finger(s): Secondary | ICD-10-CM

## 2020-02-05 DIAGNOSIS — G8929 Other chronic pain: Secondary | ICD-10-CM

## 2020-02-05 DIAGNOSIS — M79604 Pain in right leg: Secondary | ICD-10-CM | POA: Diagnosis not present

## 2020-02-05 DIAGNOSIS — M25561 Pain in right knee: Secondary | ICD-10-CM | POA: Diagnosis not present

## 2020-02-05 DIAGNOSIS — R2241 Localized swelling, mass and lump, right lower limb: Secondary | ICD-10-CM | POA: Diagnosis not present

## 2020-02-05 MED ORDER — LIDOCAINE HCL 1 % IJ SOLN
2.0000 mL | INTRAMUSCULAR | Status: AC | PRN
  Administered 2020-02-05: 2 mL

## 2020-02-05 MED ORDER — BUPIVACAINE HCL 0.25 % IJ SOLN
2.0000 mL | INTRAMUSCULAR | Status: AC | PRN
  Administered 2020-02-05: 2 mL via INTRA_ARTICULAR

## 2020-02-05 MED ORDER — METHYLPREDNISOLONE ACETATE 40 MG/ML IJ SUSP
40.0000 mg | INTRAMUSCULAR | Status: AC | PRN
  Administered 2020-02-05: 40 mg via INTRA_ARTICULAR

## 2020-02-05 NOTE — Progress Notes (Signed)
Office Visit Note   Patient: David Rangel           Date of Birth: June 16, 1971           MRN: [id number] Visit Date: 02/05/2020              Requested by: Cletis Athens, MD [address] [place],  [place] [address] PCP: Cletis Athens, MD   Assessment & Plan: Visit Diagnoses:  1. Chronic pain of right knee   2. Pain in left finger(s)   3. Pain in right leg     Plan: Impression is right knee advanced degenerative joint disease and left small finger MCP joint sprain.  In regards to the right knee, we have discussed weight loss and a possible referral to Dr. Leafy Ro. His PCP has already made a referral to weight loss clinic.  Today, We will proceed with cortisone injection and have also provided him with a handout for viscosupplementation injection.  In regards to the small finger, we will place him in an ulnar gutter splint to try to immobilize this for the next few weeks.  He may wean out of this over the next 3 to 4 weeks if his symptoms are improving.  If he has continued symptoms at that point, he will follow up with [place] for repeat evaluation.  In regards to the right calf mass, we will obtain an MRI of the tibia/fibula with contrast to rule out a sarcoma.  He will follow up with [place] after MRI has been completed.  Follow-Up Instructions: Return if symptoms worsen or fail to improve.   Orders:  Orders Placed This Encounter  Procedures  . Large Joint Inj: R knee  . XR KNEE 3 VIEW RIGHT  . XR Finger Little Left  . XR Tibia/Fibula Right   No orders of the defined types were placed in this encounter.     Procedures: Large Joint Inj: R knee on 02/05/2020 9:06 AM Indications: pain Details: 22 G needle, anterolateral approach Medications: 2 mL lidocaine 1 %; 2 mL bupivacaine 0.25 %; 40 mg methylPREDNISolone acetate 40 MG/ML      Clinical Data: No additional findings.   Subjective: Chief Complaint  Patient presents with  . Right Knee - Pain  . Left Hand - Pain    HPI  patient is a pleasant 49 year old gentleman who comes in today with right knee pain and left small finger pain.  In regards to his right knee, he has had pain here for approximately 25 years following an injury while playing basketball.  He was initially seen in a walk-in clinic where he was told there is nothing wrong.  Back in 2006, he was seen by an orthopedist where he was told he had an ACL and meniscus tear.  He opted to not surgically fix these.  He has had worsening pain to the right knee since.  The pain is primarily to the medial aspect.  He describes this as a constant ache worse at night.  He does have associated swelling.  His pain is worse when he is on his feet for a long period of time.  He has been taking Goody powder without significant relief of symptoms.  He is not had a previous cortisone or viscosupplementation injection.  He also notes an area of swelling and pain to the right distal calf that he has had for several years.  He has been seen before for this without x-rays and was told that it was just a  fatty tumor.  No history of fatty tumors elsewhere.  In regards to his left small finger, the pain he has is to the MCP joint.  This began approximately 2 months ago when he fell down a set of stairs jamming his small finger.  It was initially improving but has recently started to worsen.  This was never immobilized.  The pain is worse when using that finger especially when turning the steering wheel.  Review of Systems as detailed in HPI.  All others reviewed and are negative.   Objective: Vital Signs: Ht 6\' 1"  (1.854 m)   Wt (!) 432 lb (196 kg)   BMI 57.00 kg/m   Physical Exam well-developed well-nourished gentleman in no acute distress.  Alert oriented x3.  Ortho Exam examination of his right knee shows a trace effusion.  Range of motion 0 to 90 degrees.  Medial joint line tenderness.  Moderate patellofemoral crepitus.  He is neurovascular intact distally.  Right calf has a  minimally tender mass to the distal and lateral aspect measuring approximately 7.5x7.5 cm.  Left small finger exam shows mild to moderate tenderness to the MCP joint.  Full range of motion.  He is neurovascularly intact distally.  Specialty Comments:  No specialty comments available.  Imaging: XR Finger Little Left  Result Date: 02/05/2020 No acute or structural abnormalities  XR KNEE 3 VIEW RIGHT  Result Date: 02/05/2020 Significant tricompartmental degenerative changes worse medial patellofemoral compartments with medial translation of the femur on the tibia    PMFS History: Patient Active Problem List   Diagnosis Date Noted  . Prepatellar bursitis of both knees 01/19/2020  . Tobacco abuse counseling 01/19/2020  . Sepsis secondary to UTI ([place]) 04/24/2018  . HTN (hypertension) 04/05/2018  . Morbid obesity with BMI of 50.0-59.9, adult ([place]) 04/05/2018  . OSA on CPAP 04/05/2018  . 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 E. coli UTI 04/05/2018  . Acute prostatitis   . Bacteremia due to Gram-negative bacteria   . Bacteremia 04/04/2018  . Prostate cancer ([place]) 03/08/2018   Past Medical History:  Diagnosis Date  . Asthma   . Elevated PSA   . Hypertension   . Prostate cancer ([place]) 2019  . Sleep apnea     Family History  Problem Relation Age of Onset  . Prostate cancer Paternal Uncle   . Prostate cancer Maternal Uncle   . Prostate cancer Father   . Prostate cancer Paternal Grandfather   . Bladder Cancer Neg Hx   . Kidney cancer Neg Hx     Past Surgical History:  Procedure Laterality Date  . GASTRIC BYPASS  2009  . PROSTATE BIOPSY  2019   ?postitive   Social History   Occupational History  . Not on file  Tobacco Use  . Smoking status: Current Some Day Smoker    Types: Cigars  . Smokeless tobacco: Never Used  . Tobacco comment: quit in 09/2015  Vaping Use  . Vaping Use: Never used  Substance and Sexual Activity  . Alcohol use: Yes    Alcohol/week:  2.0 standard drinks    Types: 2 Cans of beer per week  . Drug use: No  . Sexual activity: Yes    Birth control/protection: None

## 2020-02-13 ENCOUNTER — Other Ambulatory Visit: Payer: Self-pay

## 2020-02-13 ENCOUNTER — Encounter: Payer: Self-pay | Admitting: Internal Medicine

## 2020-02-13 ENCOUNTER — Ambulatory Visit: Payer: BC Managed Care – PPO | Admitting: Internal Medicine

## 2020-02-13 VITALS — BP 160/91 | HR 70 | Ht 74.0 in | Wt >= 6400 oz

## 2020-02-13 DIAGNOSIS — Z6841 Body Mass Index (BMI) 40.0 and over, adult: Secondary | ICD-10-CM

## 2020-02-13 DIAGNOSIS — Z9989 Dependence on other enabling machines and devices: Secondary | ICD-10-CM

## 2020-02-13 DIAGNOSIS — Z716 Tobacco abuse counseling: Secondary | ICD-10-CM

## 2020-02-13 DIAGNOSIS — Z9884 Bariatric surgery status: Secondary | ICD-10-CM | POA: Diagnosis not present

## 2020-02-13 DIAGNOSIS — G4733 Obstructive sleep apnea (adult) (pediatric): Secondary | ICD-10-CM

## 2020-02-13 DIAGNOSIS — I10 Essential (primary) hypertension: Secondary | ICD-10-CM

## 2020-02-13 NOTE — Progress Notes (Addendum)
Established Patient Office Visit  SUBJECTIVE:  Subjective  Patient ID: David Rangel, male    DOB: 1971-01-13  Age: 49 y.o. MRN: [id number]  CC:  Chief Complaint  Patient presents with  . Discuss weight loss surgery    HPI David Rangel is a 49 y.o. male presenting today to discuss weight loss surgery.  Patient has sleep apnea  And tightness of the both knee joint right more than the left.  Swelling of both legs.  No previous history of heart attack or stroke  He notes that he lost 170 lbs originally. He needs to lose more weight though to qualify for surgery to his knee.   Wt Readings from Last 3 Encounters:  02/13/20 (!) 427 lb 4.8 oz (193.8 kg)  02/05/20 (!) 432 lb (196 kg)  01/23/20 (!) 429 lb 9.6 oz (194.9 kg)   He has a history of prostate cancer for which he is being follow by urology.    Past Medical History:  Diagnosis Date  . Asthma   . Elevated PSA   . Hypertension   . Prostate cancer ([place]) 2019  . Sleep apnea     Past Surgical History:  Procedure Laterality Date  . GASTRIC BYPASS  2009  . PROSTATE BIOPSY  2019   ?postitive    Family History  Problem Relation Age of Onset  . Prostate cancer Paternal Uncle   . Prostate cancer Maternal Uncle   . Prostate cancer Father   . Prostate cancer Paternal Grandfather   . Bladder Cancer Neg Hx   . Kidney cancer Neg Hx     Social History   Socioeconomic History  . Marital status: Married    Spouse name: Not on file  . Number of children: Not on file  . Years of education: Not on file  . Highest education level: Not on file  Occupational History  . Not on file  Tobacco Use  . Smoking status: Current Some Day Smoker    Types: Cigars  . Smokeless tobacco: Never Used  . Tobacco comment: quit in 09/2015  Vaping Use  . Vaping Use: Never used  Substance and Sexual Activity  . Alcohol use: Yes    Alcohol/week: 2.0 standard drinks    Types: 2 Cans of beer per week  . Drug use: No  . Sexual activity:  Yes    Birth control/protection: None  Other Topics Concern  . Not on file  Social History Narrative  . Not on file   Social Determinants of Health   Financial Resource Strain:   . Difficulty of Paying Living Expenses:   Food Insecurity:   . Worried About Charity fundraiser in the Last Year:   . Arboriculturist in the Last Year:   Transportation Needs:   . Film/video editor (Medical):   Marland Kitchen Lack of Transportation (Non-Medical):   Physical Activity:   . Days of Exercise per Week:   . Minutes of Exercise per Session:   Stress:   . Feeling of Stress :   Social Connections:   . Frequency of Communication with Friends and Family:   . Frequency of Social Gatherings with Friends and Family:   . Attends Religious Services:   . Active Member of Clubs or Organizations:   . Attends Archivist Meetings:   Marland Kitchen Marital Status:   Intimate Partner Violence:   . Fear of Current or Ex-Partner:   . Emotionally Abused:   Marland Kitchen Physically Abused:   .  Sexually Abused:      Current Outpatient Medications:  .  albuterol (PROVENTIL HFA;VENTOLIN HFA) 108 (90 Base) MCG/ACT inhaler, Inhale 1-2 puffs into the lungs every 6 (six) hours as needed for wheezing or shortness of breath., Disp: , Rfl:  .  amLODipine (NORVASC) 10 MG tablet, Take 10 mg by mouth daily., Disp: , Rfl: 5 .  hydrochlorothiazide (HYDRODIURIL) 25 MG tablet, Take 25 mg by mouth daily., Disp: , Rfl:  .  losartan (COZAAR) 100 MG tablet, Take 200 mg by mouth daily., Disp: , Rfl:  .  Multiple Vitamin (MULTIVITAMIN WITH MINERALS) TABS tablet, Take 1 tablet by mouth daily., Disp: , Rfl:  .  tadalafil (ADCIRCA/CIALIS) 20 MG tablet, Take 20 mg by mouth once a week., Disp: , Rfl: 4   Allergies  Allergen Reactions  . Cashew Nut Oil Anaphylaxis    ROS Review of Systems  Constitutional: Negative.   HENT: Negative.   Eyes: Negative.   Respiratory: Negative.   Cardiovascular: Negative.   Gastrointestinal: Negative.     Endocrine: Negative.   Genitourinary: Negative.   Musculoskeletal: Positive for arthralgias (knees (improved)).  Skin: Negative.   Allergic/Immunologic: Negative.   Neurological: Negative.   Hematological: Negative.   Psychiatric/Behavioral: Negative.   All other systems reviewed and are negative.    OBJECTIVE:    Physical Exam Vitals reviewed.  Constitutional:      Appearance: Normal appearance. He is obese.  Neck:     Vascular: No carotid bruit.  Cardiovascular:     Rate and Rhythm: Normal rate and regular rhythm.     Pulses: Normal pulses.     Heart sounds: Normal heart sounds.  Pulmonary:     Effort: Pulmonary effort is normal.     Breath sounds: Normal breath sounds.  Abdominal:     General: Bowel sounds are normal.     Palpations: Abdomen is soft. There is no hepatomegaly or splenomegaly.     Tenderness: There is no abdominal tenderness.     Hernia: No hernia is present.  Musculoskeletal:     Right lower leg: 2+ Edema (knee R>L, ankles) present.     Left lower leg: 2+ Edema (knee R>L, ankles) present.     Comments: Lumpy mass on the right calf not suspicious for blood clot.   Skin:    Findings: No rash.  Neurological:     Mental Status: He is alert and oriented to person, place, and time.  Psychiatric:        Mood and Affect: Mood normal.        Behavior: Behavior normal.     BP (!) 160/91   Pulse 70   Ht 6\' 2"  (1.88 m)   Wt (!) 427 lb 4.8 oz (193.8 kg)   BMI 54.86 kg/m  Wt Readings from Last 3 Encounters:  02/13/20 (!) 427 lb 4.8 oz (193.8 kg)  02/05/20 (!) 432 lb (196 kg)  01/23/20 (!) 429 lb 9.6 oz (194.9 kg)    Health Maintenance Due  Topic Date Due  . Hepatitis C Screening  Never done  . COVID-19 Vaccine (1) Never done  . TETANUS/TDAP  Never done  . COLONOSCOPY  Never done    There are no preventive care reminders to display for this patient.  CBC Latest Ref Rng & Units 01/19/2020 04/25/2018 04/24/2018  WBC 3.8 - 10.8 Thousand/uL 5.3 9.5  17.0(H)  Hemoglobin 13.2 - 17.1 g/dL 14.3 12.2(L) 11.3(L)  Hematocrit 38 - 50 % 42.6 37.3(L) 34.0(L)  Platelets  140 - 400 Thousand/uL 189 277 269   CMP Latest Ref Rng & Units 01/19/2020 04/25/2018 04/24/2018  Glucose 65 - 99 mg/dL 79 103(H) 108(H)  BUN 7 - 25 mg/dL 11 10 11   Creatinine 0.70 - 1.33 mg/dL 1.19 0.99 1.21  Sodium 135 - 146 mmol/L 141 140 138  Potassium 3.5 - 5.3 mmol/L 4.9 3.8 3.2(L)  Chloride 98 - 110 mmol/L 107 103 107  CO2 20 - 32 mmol/L 20 27 25   Calcium 8.6 - 10.3 mg/dL 9.5 8.6(L) 8.1(L)  Total Protein 6.1 - 8.1 g/dL 7.4 - -  Total Bilirubin 0.2 - 1.2 mg/dL 0.4 - -  Alkaline Phos 38 - 126 U/L - - -  AST 10 - 35 U/L 19 - -  ALT 9 - 46 U/L 18 - -    Lab Results  Component Value Date   TSH 1.14 01/19/2020   Lab Results  Component Value Date   ALBUMIN 3.6 04/24/2018   ANIONGAP 10 04/25/2018   No results found for: CHOL, HDL, LDLCALC, CHOLHDL No results found for: TRIG No results found for: HGBA1C    ASSESSMENT & PLAN:   Problem List Items Addressed This Visit      Cardiovascular and Mediastinum   HTN (hypertension)    Patient is on losartan hydrochlorothiazide and amlodipine to lower the blood pressure.  he was instructed on low-salt diet.        Respiratory   OSA on CPAP    Patient uses CPAP machine regularly.        Other   Morbid obesity with BMI of 50.0-59.9, adult [facility]) - Primary    Patient was started on the [place] will be referred for weight loss surgery      Status post gastric bypass for obesity   Tobacco abuse counseling    Tobacco cessation was also discussed with the patient.  Also advised not to drink alcohol.         No orders of the defined types were placed in this encounter.    Instructed talk to weight loss clinic for Dietitian}  Follow-up: No follow-ups on file.    Dr. Jane Canary [facility] [address] [place], [place] [address]   By signing my name below, I, [employer], attest  that this documentation has been prepared under the direction and in the presence of Cletis Athens, MD. Electronically Signed: Cletis Athens, MD 02/13/20, 12:10 PM  I personally performed the services described in this documentation, which was SCRIBED in my presence. The recorded information has been reviewed and considered accurate. It has been edited as necessary during review. Cletis Athens, MD

## 2020-02-13 NOTE — Assessment & Plan Note (Signed)
Patient was started on the [place] will be referred for weight loss surgery

## 2020-02-13 NOTE — Assessment & Plan Note (Signed)
Tobacco cessation was also discussed with the patient.  Also advised not to drink alcohol.

## 2020-02-13 NOTE — Assessment & Plan Note (Signed)
Patient is on losartan hydrochlorothiazide and amlodipine to lower the blood pressure.  he was instructed on low-salt diet.

## 2020-02-13 NOTE — Patient Instructions (Signed)
Exercises To Do While Sitting  Exercises that you do while sitting (chair exercises) can give you many of the same benefits as full exercise. Benefits include strengthening your heart, burning calories, and keeping muscles and joints healthy. Exercise can also improve your mood and help with depression and anxiety. You may benefit from chair exercises if you are unable to do standing exercises because of:  Diabetic foot pain.  Obesity.  Illness.  Arthritis.  Recovery from surgery or injury.  Breathing problems.  Balance problems.  Another type of disability. Before starting chair exercises, check with your health care provider or a physical therapist to find out how much exercise you can tolerate and which exercises are safe for you. If your health care provider approves:  Start out slowly and build up over time. Aim to work up to about 10-20 minutes for each exercise session.  Make exercise part of your daily routine.  Drink water when you exercise. Do not wait until you are thirsty. Drink every 10-15 minutes.  Stop exercising right away if you have pain, nausea, shortness of breath, or dizziness.  If you are exercising in a wheelchair, make sure to lock the wheels.  Ask your health care provider whether you can do tai chi or yoga. Many positions in these mind-body exercises can be modified to do while seated. Warm-up Before starting other exercises: 1. Sit up as straight as you can. Have your knees bent at 90 degrees, which is the shape of the capital letter "L." Keep your feet flat on the floor. 2. Sit at the front edge of your chair, if you can. 3. Pull in (tighten) the muscles in your abdomen and stretch your spine and neck as straight as you can. Hold this position for a few minutes. 4. Breathe in and out evenly. Try to concentrate on your breathing, and relax your mind. Stretching Exercise A: Arm stretch 1. Hold your arms out straight in front of your body. 2. Bend  your hands at the wrist with your fingers pointing up, as if signaling someone to stop. Notice the slight tension in your forearms as you hold the position. 3. Keeping your arms out and your hands bent, rotate your hands outward as far as you can and hold this stretch. Aim to have your thumbs pointing up and your pinkie fingers pointing down. Slowly repeat arm stretches for one minute as tolerated. Exercise B: Leg stretch 1. If you can move your legs, try to "draw" letters on the floor with the toes of your foot. Write your name with one foot. 2. Write your name with the toes of your other foot. Slowly repeat the movements for one minute as tolerated. Exercise C: Reach for the sky 1. Reach your hands as far over your head as you can to stretch your spine. 2. Move your hands and arms as if you are climbing a rope. Slowly repeat the movements for one minute as tolerated. Range of motion exercises Exercise A: Shoulder roll 1. Let your arms hang loosely at your sides. 2. Lift just your shoulders up toward your ears, then let them relax back down. 3. When your shoulders feel loose, rotate your shoulders in backward and forward circles. Do shoulder rolls slowly for one minute as tolerated. Exercise B: March in place 1. As if you are marching, pump your arms and lift your legs up and down. Lift your knees as high as you can. ? If you are unable to lift your knees,  just pump your arms and move your ankles and feet up and down. March in place for one minute as tolerated. Exercise C: Seated jumping jacks 1. Let your arms hang down straight. 2. Keeping your arms straight, lift them up over your head. Aim to point your fingers to the ceiling. 3. While you lift your arms, straighten your legs and slide your heels along the floor to your sides, as wide as you can. 4. As you bring your arms back down to your sides, slide your legs back together. ? If you are unable to use your legs, just move your  arms. Slowly repeat seated jumping jacks for one minute as tolerated. Strengthening exercises Exercise A: Shoulder squeeze 1. Hold your arms straight out from your body to your sides, with your elbows bent and your fists pointed at the ceiling. 2. Keeping your arms in the bent position, move them forward so your elbows and forearms meet in front of your face. 3. Open your arms back out as wide as you can with your elbows still bent, until you feel your shoulder blades squeezing together. Hold for 5 seconds. Slowly repeat the movements forward and backward for one minute as tolerated. Contact a health care provider if you: 1. Had to stop exercising due to any of the following: ? Pain. ? Nausea. ? Shortness of breath. ? Dizziness. ? Fatigue. 2. Have significant pain or soreness after exercising. Get help right away if you have:  Chest pain.  Difficulty breathing. These symptoms may represent a serious problem that is an emergency. Do not wait to see if the symptoms will go away. Get medical help right away. Call your local emergency services (911 in the [place] Do not drive yourself to the hospital. This information is not intended to replace advice given to you by your health care provider. Make sure you discuss any questions you have with your health care provider. Document Revised: 11/07/2018 Document Reviewed: 05/30/2017 Elsevier Patient Education  2020 [place] [employer] Calories are units of energy. Your body needs a certain amount of calories from food to keep you going throughout the day. When you eat more calories than your body needs, your body stores the extra calories as fat. When you eat fewer calories than your body needs, your body burns fat to get the energy it needs. Calorie counting means keeping track of how many calories you eat and drink each day. Calorie counting can be helpful if you need to lose weight. If you make sure to eat fewer  calories than your body needs, you should lose weight. Ask your health care provider what a healthy weight is for you. For calorie counting to work, you will need to eat the right number of calories in a day in order to lose a healthy amount of weight per week. A dietitian can help you determine how many calories you need in a day and will give you suggestions on how to reach your calorie goal.  A healthy amount of weight to lose per week is usually 1-2 lb (0.5-0.9 kg). This usually means that your daily calorie intake should be reduced by 500-750 calories.  Eating 1,200 - 1,500 calories per day can help most women lose weight.  Eating 1,500 - 1,800 calories per day can help most men lose weight. What is my plan? My goal is to have __________ calories per day. If I have this many calories per day, I should lose  around __________ pounds per week. What do I need to know about calorie counting? In order to meet your daily calorie goal, you will need to:  Find out how many calories are in each food you would like to eat. Try to do this before you eat.  Decide how much of the food you plan to eat.  Write down what you ate and how many calories it had. Doing this is called keeping a food log. To successfully lose weight, it is important to balance calorie counting with a healthy lifestyle that includes regular activity. Aim for 150 minutes of moderate exercise (such as walking) or 75 minutes of vigorous exercise (such as running) each week. Where do I find calorie information?  The number of calories in a food can be found on a Nutrition Facts label. If a food does not have a Nutrition Facts label, try to look up the calories online or ask your dietitian for help. Remember that calories are listed per serving. If you choose to have more than one serving of a food, you will have to multiply the calories per serving by the amount of servings you plan to eat. For example, the label on a package of  bread might say that a serving size is 1 slice and that there are 90 calories in a serving. If you eat 1 slice, you will have eaten 90 calories. If you eat 2 slices, you will have eaten 180 calories. How do I keep a food log? Immediately after each meal, record the following information in your food log:  What you ate. Don't forget to include toppings, sauces, and other extras on the food.  How much you ate. This can be measured in cups, ounces, or number of items.  How many calories each food and drink had.  The total number of calories in the meal. Keep your food log near you, such as in a small notebook in your pocket, or use a mobile app or website. Some programs will calculate calories for you and show you how many calories you have left for the day to meet your goal. What are some calorie counting tips?  2. Use your calories on foods and drinks that will fill you up and not leave you hungry: ? Some examples of foods that fill you up are nuts and nut butters, vegetables, lean proteins, and high-fiber foods like whole grains. High-fiber foods are foods with more than 5 g fiber per serving. ? Drinks such as sodas, specialty coffee drinks, alcohol, and juices have a lot of calories, yet do not fill you up. 3. Eat nutritious foods and avoid empty calories. Empty calories are calories you get from foods or beverages that do not have many vitamins or protein, such as candy, sweets, and soda. It is better to have a nutritious high-calorie food (such as an avocado) than a food with few nutrients (such as a bag of chips). 4. Know how many calories are in the foods you eat most often. This will help you calculate calorie counts faster. 5. Pay attention to calories in drinks. Low-calorie drinks include water and unsweetened drinks. 6. Pay attention to nutrition labels for "low fat" or "fat free" foods. These foods sometimes have the same amount of calories or more calories than the full fat versions.  They also often have added sugar, starch, or salt, to make up for flavor that was removed with the fat. 7. Find a way of tracking calories that works for  you. Get creative. Try different apps or programs if writing down calories does not work for you. What are some portion control tips?  Know how many calories are in a serving. This will help you know how many servings of a certain food you can have.  Use a measuring cup to measure serving sizes. You could also try weighing out portions on a kitchen scale. With time, you will be able to estimate serving sizes for some foods.  Take some time to put servings of different foods on your favorite plates, bowls, and cups so you know what a serving looks like.  Try not to eat straight from a bag or box. Doing this can lead to overeating. Put the amount you would like to eat in a cup or on a plate to make sure you are eating the right portion.  Use smaller plates, glasses, and bowls to prevent overeating.  Try not to multitask (for example, watch TV or use your computer) while eating. If it is time to eat, sit down at a table and enjoy your food. This will help you to know when you are full. It will also help you to be aware of what you are eating and how much you are eating. What are tips for following this plan? Reading food labels  Check the calorie count compared to the serving size. The serving size may be smaller than what you are used to eating.  Check the source of the calories. Make sure the food you are eating is high in vitamins and protein and low in saturated and trans fats. Shopping  Read nutrition labels while you shop. This will help you make healthy decisions before you decide to purchase your food.  Make a grocery list and stick to it. Cooking  Try to cook your favorite foods in a healthier way. For example, try baking instead of frying.  Use low-fat dairy products. Meal planning  Use more fruits and vegetables. Half of  your plate should be fruits and vegetables.  Include lean proteins like poultry and fish. How do I count calories when eating out? 5. Ask for smaller portion sizes. 6. Consider sharing an entree and sides instead of getting your own entree. 7. If you get your own entree, eat only half. Ask for a box at the beginning of your meal and put the rest of your entree in it so you are not tempted to eat it. 8. If calories are listed on the menu, choose the lower calorie options. 9. Choose dishes that include vegetables, fruits, whole grains, low-fat dairy products, and lean protein. 10. Choose items that are boiled, broiled, grilled, or steamed. Stay away from items that are buttered, battered, fried, or served with cream sauce. Items labeled "crispy" are usually fried, unless stated otherwise. 11. Choose water, low-fat milk, unsweetened iced tea, or other drinks without added sugar. If you want an alcoholic beverage, choose a lower calorie option such as a glass of wine or light beer. 12. Ask for dressings, sauces, and syrups on the side. These are usually high in calories, so you should limit the amount you eat. 13. If you want a salad, choose a garden salad and ask for grilled meats. Avoid extra toppings like bacon, cheese, or fried items. Ask for the dressing on the side, or ask for olive oil and vinegar or lemon to use as dressing. 14. Estimate how many servings of a food you are given. For example, a serving of cooked  rice is  cup or about the size of half a baseball. Knowing serving sizes will help you be aware of how much food you are eating at restaurants. The list below tells you how big or small some common portion sizes are based on everyday objects: ? 1 oz--4 stacked dice. ? 3 oz--1 deck of cards. ? 1 tsp--1 die. ? 1 Tbsp-- a ping-pong ball. ? 2 Tbsp--1 ping-pong ball. ?  cup-- baseball. ? 1 cup--1 baseball. Summary  Calorie counting means keeping track of how many calories you eat  and drink each day. If you eat fewer calories than your body needs, you should lose weight.  A healthy amount of weight to lose per week is usually 1-2 lb (0.5-0.9 kg). This usually means reducing your daily calorie intake by 500-750 calories.  The number of calories in a food can be found on a Nutrition Facts label. If a food does not have a Nutrition Facts label, try to look up the calories online or ask your dietitian for help.  Use your calories on foods and drinks that will fill you up, and not on foods and drinks that will leave you hungry.  Use smaller plates, glasses, and bowls to prevent overeating. This information is not intended to replace advice given to you by your health care provider. Make sure you discuss any questions you have with your health care provider.  Document Revised: 04/05/2018 Document Reviewed: 06/16/2016 Elsevier Patient Education  [place].

## 2020-02-13 NOTE — Assessment & Plan Note (Signed)
Patient uses CPAP machine regularly. 

## 2020-03-02 ENCOUNTER — Encounter: Payer: Self-pay | Admitting: Internal Medicine

## 2020-03-02 ENCOUNTER — Ambulatory Visit: Payer: BC Managed Care – PPO | Admitting: Internal Medicine

## 2020-03-02 ENCOUNTER — Other Ambulatory Visit: Payer: Self-pay

## 2020-03-02 DIAGNOSIS — Z716 Tobacco abuse counseling: Secondary | ICD-10-CM

## 2020-03-02 DIAGNOSIS — G4733 Obstructive sleep apnea (adult) (pediatric): Secondary | ICD-10-CM | POA: Diagnosis not present

## 2020-03-02 DIAGNOSIS — Z9989 Dependence on other enabling machines and devices: Secondary | ICD-10-CM

## 2020-03-02 DIAGNOSIS — I10 Essential (primary) hypertension: Secondary | ICD-10-CM | POA: Diagnosis not present

## 2020-03-02 DIAGNOSIS — M1711 Unilateral primary osteoarthritis, right knee: Secondary | ICD-10-CM

## 2020-03-02 DIAGNOSIS — Z6841 Body Mass Index (BMI) 40.0 and over, adult: Secondary | ICD-10-CM

## 2020-03-02 MED ORDER — PHENTERMINE HCL 15 MG PO CAPS: 15.0000 mg | ORAL_CAPSULE | ORAL | 1 refills | Status: DC

## 2020-03-02 NOTE — Assessment & Plan Note (Signed)
-   Today, the patient's blood pressure is stable on Amlodipine, HCTZ, and Losartan. - The patient will continue the current treatment regimen.  - I encouraged the patient to eat a low-sodium diet to help control blood pressure. - I encouraged the patient to live an active lifestyle and complete activities that increases heart rate to 85% target heart rate at least 5 times per week for one hour.

## 2020-03-02 NOTE — Progress Notes (Signed)
Established Patient Office Visit  SUBJECTIVE:  Subjective  Patient ID: David Rangel, male    DOB: 1970/10/30  Age: 49 y.o. MRN: [id number]  CC:  Chief Complaint  Patient presents with  . morbid obesity    patient here for 2 week weight check   . Hypertension    HPI David Rangel is a 49 y.o. male presenting today for follow-up to discuss weight loss options. The patient states that he is feeling well today and is taking all medications as prescribed. He denies chest pain, shortness of breath, abdominal pain, and difficulty swallowing.  Since his last visit on 02/13/2020, he has lost two pounds. He states that his right knee pain has improved since receiving a Cortisone shot. However, his orthopedic surgeon will not proceed with right knee surgery until he weighs approximately 300 lbs. Of note, he underwent weight loss surgery in 2009.  The patient reports smoking one cigarette a day. He states that he is going to get his first dose of the COVID-19 vaccination later today.  Past Medical History:  Diagnosis Date  . Asthma   . Elevated PSA   . Hypertension   . Prostate cancer ([place]) 2019  . Sleep apnea     Past Surgical History:  Procedure Laterality Date  . GASTRIC BYPASS  2009  . PROSTATE BIOPSY  2019   ?postitive    Family History  Problem Relation Age of Onset  . Prostate cancer Paternal Uncle   . Prostate cancer Maternal Uncle   . Prostate cancer Father   . Prostate cancer Paternal Grandfather   . Bladder Cancer Neg Hx   . Kidney cancer Neg Hx     Social History   Socioeconomic History  . Marital status: Married    Spouse name: Not on file  . Number of children: Not on file  . Years of education: Not on file  . Highest education level: Not on file  Occupational History  . Not on file  Tobacco Use  . Smoking status: Current Some Day Smoker    Types: Cigars  . Smokeless tobacco: Never Used  . Tobacco comment: quit in 09/2015  Vaping Use  . Vaping Use:  Never used  Substance and Sexual Activity  . Alcohol use: Yes    Alcohol/week: 2.0 standard drinks    Types: 2 Cans of beer per week  . Drug use: No  . Sexual activity: Yes    Birth control/protection: None  Other Topics Concern  . Not on file  Social History Narrative  . Not on file   Social Determinants of Health   Financial Resource Strain:   . Difficulty of Paying Living Expenses:   Food Insecurity:   . Worried About Charity fundraiser in the Last Year:   . Arboriculturist in the Last Year:   Transportation Needs:   . Film/video editor (Medical):   Marland Kitchen Lack of Transportation (Non-Medical):   Physical Activity:   . Days of Exercise per Week:   . Minutes of Exercise per Session:   Stress:   . Feeling of Stress :   Social Connections:   . Frequency of Communication with Friends and Family:   . Frequency of Social Gatherings with Friends and Family:   . Attends Religious Services:   . Active Member of Clubs or Organizations:   . Attends Archivist Meetings:   Marland Kitchen Marital Status:   Intimate Partner Violence:   . Fear of Current  or Ex-Partner:   . Emotionally Abused:   Marland Kitchen Physically Abused:   . Sexually Abused:      Current Outpatient Medications:  .  albuterol (PROVENTIL HFA;VENTOLIN HFA) 108 (90 Base) MCG/ACT inhaler, Inhale 1-2 puffs into the lungs every 6 (six) hours as needed for wheezing or shortness of breath., Disp: , Rfl:  .  amLODipine (NORVASC) 10 MG tablet, Take 10 mg by mouth daily., Disp: , Rfl: 5 .  hydrochlorothiazide (HYDRODIURIL) 25 MG tablet, Take 25 mg by mouth daily., Disp: , Rfl:  .  losartan (COZAAR) 100 MG tablet, Take 200 mg by mouth daily., Disp: , Rfl:  .  Multiple Vitamin (MULTIVITAMIN WITH MINERALS) TABS tablet, Take 1 tablet by mouth daily., Disp: , Rfl:  .  tadalafil (ADCIRCA/CIALIS) 20 MG tablet, Take 20 mg by mouth once a week., Disp: , Rfl: 4 .  phentermine 15 MG capsule, Take 1 capsule (15 mg total) by mouth every morning.,  Disp: 30 capsule, Rfl: 1   Allergies  Allergen Reactions  . Cashew Nut Oil Anaphylaxis    ROS Review of Systems  Constitutional: Negative.   HENT: Negative.   Eyes: Negative.   Respiratory: Negative.   Cardiovascular: Negative.   Gastrointestinal: Negative.   Endocrine: Negative.   Genitourinary: Negative.   Musculoskeletal: Negative.   Skin: Negative.   Allergic/Immunologic: Negative.   Neurological: Negative.   Hematological: Negative.   Psychiatric/Behavioral: Negative.   All other systems reviewed and are negative.    OBJECTIVE:    Physical Exam Vitals reviewed.  Constitutional:      Appearance: Normal appearance. He is obese.  HENT:     Mouth/Throat:     Mouth: Mucous membranes are moist.  Eyes:     Pupils: Pupils are equal, round, and reactive to light.  Neck:     Vascular: No carotid bruit.  Cardiovascular:     Rate and Rhythm: Normal rate and regular rhythm.     Pulses: Normal pulses.     Heart sounds: Normal heart sounds.  Pulmonary:     Effort: Pulmonary effort is normal.     Breath sounds: Normal breath sounds.  Abdominal:     General: Bowel sounds are normal.     Palpations: Abdomen is soft. There is no hepatomegaly, splenomegaly or mass.     Tenderness: There is no abdominal tenderness.     Hernia: No hernia is present.  Musculoskeletal:     Cervical back: Neck supple.     Right knee: Swelling present.     Left knee: Swelling present.  Skin:    Findings: No rash.  Neurological:     Mental Status: He is alert and oriented to person, place, and time.     Motor: No weakness.  Psychiatric:        Mood and Affect: Mood normal.        Behavior: Behavior normal.     BP (!) 150/78   Pulse 72   Ht 6\' 2"  (1.88 m)   Wt (!) 425 lb 4.8 oz (192.9 kg)   BMI 54.61 kg/m  Wt Readings from Last 3 Encounters:  03/02/20 (!) 425 lb 4.8 oz (192.9 kg)  02/13/20 (!) 427 lb 4.8 oz (193.8 kg)  02/05/20 (!) 432 lb (196 kg)    Health Maintenance Due    Topic Date Due  . Hepatitis C Screening  Never done  . COVID-19 Vaccine (1) Never done  . TETANUS/TDAP  Never done  . COLONOSCOPY  Never done  .  INFLUENZA VACCINE  02/29/2020    There are no preventive care reminders to display for this patient.  CBC Latest Ref Rng & Units 01/19/2020 04/25/2018 04/24/2018  WBC 3.8 - 10.8 Thousand/uL 5.3 9.5 17.0(H)  Hemoglobin 13.2 - 17.1 g/dL 14.3 12.2(L) 11.3(L)  Hematocrit 38 - 50 % 42.6 37.3(L) 34.0(L)  Platelets 140 - 400 Thousand/uL 189 277 269   CMP Latest Ref Rng & Units 01/19/2020 04/25/2018 04/24/2018  Glucose 65 - 99 mg/dL 79 103(H) 108(H)  BUN 7 - 25 mg/dL 11 10 11   Creatinine 0.70 - 1.33 mg/dL 1.19 0.99 1.21  Sodium 135 - 146 mmol/L 141 140 138  Potassium 3.5 - 5.3 mmol/L 4.9 3.8 3.2(L)  Chloride 98 - 110 mmol/L 107 103 107  CO2 20 - 32 mmol/L 20 27 25   Calcium 8.6 - 10.3 mg/dL 9.5 8.6(L) 8.1(L)  Total Protein 6.1 - 8.1 g/dL 7.4 - -  Total Bilirubin 0.2 - 1.2 mg/dL 0.4 - -  Alkaline Phos 38 - 126 U/L - - -  AST 10 - 35 U/L 19 - -  ALT 9 - 46 U/L 18 - -    Lab Results  Component Value Date   TSH 1.14 01/19/2020   Lab Results  Component Value Date   ALBUMIN 3.6 04/24/2018   ANIONGAP 10 04/25/2018   No results found for: CHOL, HDL, LDLCALC, CHOLHDL No results found for: TRIG No results found for: HGBA1C    ASSESSMENT & PLAN:   Problem List Items Addressed This Visit      Cardiovascular and Mediastinum   HTN (hypertension)    - Today, the patient's blood pressure is stable on Amlodipine, HCTZ, and Losartan. - The patient will continue the current treatment regimen.  - I encouraged the patient to eat a low-sodium diet to help control blood pressure. - I encouraged the patient to live an active lifestyle and complete activities that increases heart rate to 85% target heart rate at least 5 times per week for one hour.          Respiratory   OSA on CPAP    Stable.        Musculoskeletal and Integument   Arthritis of  right knee    The patient has received a steroid shot from his orthopedic surgeon, which has improved his knee pain.        Other   Morbid obesity with BMI of 50.0-59.9, adult ([place]) - Primary    The patient has lost two pounds since his last visit three weeks ago. Due to his stable blood pressure, we will start him on Phenteramine 15 mg P.O. daily.      Relevant Medications   phentermine 15 MG capsule   Tobacco abuse counseling    - I instructed the patient to stop smoking and provided them with smoking cessation materials.  - I informed the patient that smoking puts them at increased risk for cancer, COPD, hypertension, and more.  - Informed the patient to seek help if they begin to have trouble breathing, develop chest pain, start to cough up blood, feel faint, or pass out.           Meds ordered this encounter  Medications  . phentermine 15 MG capsule    Sig: Take 1 capsule (15 mg total) by mouth every morning.    Dispense:  30 capsule    Refill:  1    Follow-up: Return in about 4 weeks (around 03/30/2020).    Dr.  [place] [address] [place], [place] [address]   By signing my name below, I, Clerance Lav, attest that this documentation has been prepared under the direction and in the presence of Cletis Athens, MD. Electronically Signed: Cletis Athens, MD 03/02/20, 9:50 AM   I personally performed the services described in this documentation, which was SCRIBED in my presence. The recorded information has been reviewed and considered accurate. It has been edited as necessary during review. Cletis Athens, MD

## 2020-03-02 NOTE — Assessment & Plan Note (Signed)
-   I instructed the patient to stop smoking and provided them with smoking cessation materials.  - I informed the patient that smoking puts them at increased risk for cancer, COPD, hypertension, and more.  - Informed the patient to seek help if they begin to have trouble breathing, develop chest pain, start to cough up blood, feel faint, or pass out.  

## 2020-03-02 NOTE — Assessment & Plan Note (Signed)
The patient has lost two pounds since his last visit three weeks ago. Due to his stable blood pressure, we will start him on Phenteramine 15 mg P.O. daily.

## 2020-03-02 NOTE — Assessment & Plan Note (Signed)
The patient has received a steroid shot from his orthopedic surgeon, which has improved his knee pain.

## 2020-03-02 NOTE — Assessment & Plan Note (Signed)
Stable

## 2020-03-06 ENCOUNTER — Ambulatory Visit
Admission: RE | Admit: 2020-03-06 | Discharge: 2020-03-06 | Disposition: A | Discharge status: Home / Self Care | Payer: BC Managed Care – PPO | Source: Ambulatory Visit | Attending: Orthopaedic Surgery | Admitting: Orthopaedic Surgery

## 2020-03-06 ENCOUNTER — Other Ambulatory Visit: Payer: Self-pay

## 2020-03-06 DIAGNOSIS — R2241 Localized swelling, mass and lump, right lower limb: Secondary | ICD-10-CM | POA: Diagnosis not present

## 2020-03-06 DIAGNOSIS — I8391 Asymptomatic varicose veins of right lower extremity: Secondary | ICD-10-CM | POA: Diagnosis not present

## 2020-03-06 MED ORDER — GADOBENATE DIMEGLUMINE 529 MG/ML IV SOLN
20.0000 mL | Freq: Once | INTRAVENOUS | Status: AC | PRN
  Administered 2020-03-06: 20 mL via INTRAVENOUS

## 2020-03-09 ENCOUNTER — Ambulatory Visit (INDEPENDENT_AMBULATORY_CARE_PROVIDER_SITE_OTHER): Payer: BC Managed Care – PPO | Admitting: Orthopaedic Surgery

## 2020-03-09 VITALS — Ht 74.0 in | Wt >= 6400 oz

## 2020-03-09 DIAGNOSIS — R2241 Localized swelling, mass and lump, right lower limb: Secondary | ICD-10-CM | POA: Diagnosis not present

## 2020-03-09 DIAGNOSIS — M1711 Unilateral primary osteoarthritis, right knee: Secondary | ICD-10-CM

## 2020-03-09 DIAGNOSIS — Z6841 Body Mass Index (BMI) 40.0 and over, adult: Secondary | ICD-10-CM

## 2020-03-09 NOTE — Progress Notes (Signed)
Office Visit Note   Patient: David Rangel           Date of Birth: 1970/11/05           MRN: [id number] Visit Date: 03/09/2020              Requested by: Cletis Athens, MD [address] [place],  [place] [id number] PCP: Cletis Athens, MD   Assessment & Plan: Visit Diagnoses:  1. Lower leg mass, right   2. Primary osteoarthritis of right knee   3. Body mass index 50.0-59.9, adult ([place])   4. Morbid obesity ([place])     Plan: MRI of the right leg shows subcutaneous fat without any evidence of malignancy.  These findings were reviewed with the patient detail.  Reassurance was provided.  In terms of his severe right knee DJD he understands that he will need to obtain a BMI of 40 or less before he is a surgical candidate.    Follow-up as needed. The patient meets the AMA guidelines for Morbid (severe) obesity with a BMI > 40.0 and I have recommended weight loss.  Follow-Up Instructions: Return if symptoms worsen or fail to improve.   Orders:  No orders of the defined types were placed in this encounter.  No orders of the defined types were placed in this encounter.     Procedures: No procedures performed   Clinical Data: No additional findings.   Subjective: Chief Complaint  Patient presents with  . Follow-up    David Rangel returns today for MRI review of the right leg as well as follow-up of right knee DJD.  MRI findings are consistent with fatty tissue without any concerns for malignancy.  His right knee continues to bother him.   Review of Systems   Objective: Vital Signs: Ht 6\' 2"  (1.88 m)   Wt (!) 420 lb (190.5 kg)   BMI 53.92 kg/m   Physical Exam  Ortho Exam Right lower extremity exams are unchanged. Specialty Comments:  No specialty comments available.  Imaging: No results found.   PMFS History: Patient Active Problem List   Diagnosis Date Noted  . Arthritis of right knee 03/02/2020  . Prepatellar bursitis of both knees 01/19/2020  . Tobacco abuse  counseling 01/19/2020  . Sepsis secondary to UTI ([place]) 04/24/2018  . HTN (hypertension) 04/05/2018  . Morbid obesity with BMI of 50.0-59.9, adult ([place]) 04/05/2018  . OSA on CPAP 04/05/2018  . 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 E. coli UTI 04/05/2018  . Acute prostatitis   . Bacteremia due to Gram-negative bacteria   . Bacteremia 04/04/2018  . Prostate cancer ([place]) 03/08/2018   Past Medical History:  Diagnosis Date  . Asthma   . Elevated PSA   . Hypertension   . Prostate cancer ([place]) 2019  . Sleep apnea     Family History  Problem Relation Age of Onset  . Prostate cancer Paternal Uncle   . Prostate cancer Maternal Uncle   . Prostate cancer Father   . Prostate cancer Paternal Grandfather   . Bladder Cancer Neg Hx   . Kidney cancer Neg Hx     Past Surgical History:  Procedure Laterality Date  . GASTRIC BYPASS  2009  . PROSTATE BIOPSY  2019   ?postitive   Social History   Occupational History  . Not on file  Tobacco Use  . Smoking status: Current Some Day Smoker    Types: Cigars  . Smokeless tobacco: Never Used  .  Tobacco comment: quit in 09/2015  Vaping Use  . Vaping Use: Never used  Substance and Sexual Activity  . Alcohol use: Yes    Alcohol/week: 2.0 standard drinks    Types: 2 Cans of beer per week  . Drug use: No  . Sexual activity: Yes    Birth control/protection: None

## 2020-03-24 DIAGNOSIS — M1711 Unilateral primary osteoarthritis, right knee: Secondary | ICD-10-CM | POA: Diagnosis not present

## 2020-03-30 ENCOUNTER — Ambulatory Visit (INDEPENDENT_AMBULATORY_CARE_PROVIDER_SITE_OTHER): Payer: BC Managed Care – PPO | Admitting: Internal Medicine

## 2020-03-30 ENCOUNTER — Encounter: Payer: Self-pay | Admitting: Internal Medicine

## 2020-03-30 ENCOUNTER — Other Ambulatory Visit: Payer: Self-pay

## 2020-03-30 VITALS — BP 172/89 | HR 83 | Ht 74.0 in | Wt >= 6400 oz

## 2020-03-30 DIAGNOSIS — Z716 Tobacco abuse counseling: Secondary | ICD-10-CM | POA: Diagnosis not present

## 2020-03-30 DIAGNOSIS — G4733 Obstructive sleep apnea (adult) (pediatric): Secondary | ICD-10-CM | POA: Diagnosis not present

## 2020-03-30 DIAGNOSIS — M1711 Unilateral primary osteoarthritis, right knee: Secondary | ICD-10-CM

## 2020-03-30 DIAGNOSIS — Z9989 Dependence on other enabling machines and devices: Secondary | ICD-10-CM

## 2020-03-30 DIAGNOSIS — I10 Essential (primary) hypertension: Secondary | ICD-10-CM

## 2020-03-30 DIAGNOSIS — Z6841 Body Mass Index (BMI) 40.0 and over, adult: Secondary | ICD-10-CM

## 2020-03-30 NOTE — Patient Instructions (Signed)
Calorie Counting for Weight Loss Calories are units of energy. Your body needs a certain amount of calories from food to keep you going throughout the day. When you eat more calories than your body needs, your body stores the extra calories as fat. When you eat fewer calories than your body needs, your body burns fat to get the energy it needs. Calorie counting means keeping track of how many calories you eat and drink each day. Calorie counting can be helpful if you need to lose weight. If you make sure to eat fewer calories than your body needs, you should lose weight. Ask your health care provider what a healthy weight is for you. For calorie counting to work, you will need to eat the right number of calories in a day in order to lose a healthy amount of weight per week. A dietitian can help you determine how many calories you need in a day and will give you suggestions on how to reach your calorie goal.  A healthy amount of weight to lose per week is usually 1-2 lb (0.5-0.9 kg). This usually means that your daily calorie intake should be reduced by 500-750 calories.  Eating 1,200 - 1,500 calories per day can help most women lose weight.  Eating 1,500 - 1,800 calories per day can help most men lose weight. What is my plan? My goal is to have __________ calories per day. If I have this many calories per day, I should lose around __________ pounds per week. What do I need to know about calorie counting? In order to meet your daily calorie goal, you will need to:  Find out how many calories are in each food you would like to eat. Try to do this before you eat.  Decide how much of the food you plan to eat.  Write down what you ate and how many calories it had. Doing this is called keeping a food log. To successfully lose weight, it is important to balance calorie counting with a healthy lifestyle that includes regular activity. Aim for 150 minutes of moderate exercise (such as walking) or 75  minutes of vigorous exercise (such as running) each week. Where do I find calorie information?  The number of calories in a food can be found on a Nutrition Facts label. If a food does not have a Nutrition Facts label, try to look up the calories online or ask your dietitian for help. Remember that calories are listed per serving. If you choose to have more than one serving of a food, you will have to multiply the calories per serving by the amount of servings you plan to eat. For example, the label on a package of bread might say that a serving size is 1 slice and that there are 90 calories in a serving. If you eat 1 slice, you will have eaten 90 calories. If you eat 2 slices, you will have eaten 180 calories. How do I keep a food log? Immediately after each meal, record the following information in your food log:  What you ate. Don't forget to include toppings, sauces, and other extras on the food.  How much you ate. This can be measured in cups, ounces, or number of items.  How many calories each food and drink had.  The total number of calories in the meal. Keep your food log near you, such as in a small notebook in your pocket, or use a mobile app or website. Some programs will calculate   calories for you and show you how many calories you have left for the day to meet your goal. What are some calorie counting tips?  1. Use your calories on foods and drinks that will fill you up and not leave you hungry: ? Some examples of foods that fill you up are nuts and nut butters, vegetables, lean proteins, and high-fiber foods like whole grains. High-fiber foods are foods with more than 5 g fiber per serving. ? Drinks such as sodas, specialty coffee drinks, alcohol, and juices have a lot of calories, yet do not fill you up. 2. Eat nutritious foods and avoid empty calories. Empty calories are calories you get from foods or beverages that do not have many vitamins or protein, such as candy, sweets, and  soda. It is better to have a nutritious high-calorie food (such as an avocado) than a food with few nutrients (such as a bag of chips). 3. Know how many calories are in the foods you eat most often. This will help you calculate calorie counts faster. 4. Pay attention to calories in drinks. Low-calorie drinks include water and unsweetened drinks. 5. Pay attention to nutrition labels for "low fat" or "fat free" foods. These foods sometimes have the same amount of calories or more calories than the full fat versions. They also often have added sugar, starch, or salt, to make up for flavor that was removed with the fat. 6. Find a way of tracking calories that works for you. Get creative. Try different apps or programs if writing down calories does not work for you. What are some portion control tips?  Know how many calories are in a serving. This will help you know how many servings of a certain food you can have.  Use a measuring cup to measure serving sizes. You could also try weighing out portions on a kitchen scale. With time, you will be able to estimate serving sizes for some foods.  Take some time to put servings of different foods on your favorite plates, bowls, and cups so you know what a serving looks like.  Try not to eat straight from a bag or box. Doing this can lead to overeating. Put the amount you would like to eat in a cup or on a plate to make sure you are eating the right portion.  Use smaller plates, glasses, and bowls to prevent overeating.  Try not to multitask (for example, watch TV or use your computer) while eating. If it is time to eat, sit down at a table and enjoy your food. This will help you to know when you are full. It will also help you to be aware of what you are eating and how much you are eating. What are tips for following this plan? Reading food labels  Check the calorie count compared to the serving size. The serving size may be smaller than what you are used  to eating.  Check the source of the calories. Make sure the food you are eating is high in vitamins and protein and low in saturated and trans fats. Shopping  Read nutrition labels while you shop. This will help you make healthy decisions before you decide to purchase your food.  Make a grocery list and stick to it. Cooking  Try to cook your favorite foods in a healthier way. For example, try baking instead of frying.  Use low-fat dairy products. Meal planning  Use more fruits and vegetables. Half of your plate should be fruits  and vegetables.  Include lean proteins like poultry and fish. How do I count calories when eating out? 1. Ask for smaller portion sizes. 2. Consider sharing an entree and sides instead of getting your own entree. 3. If you get your own entree, eat only half. Ask for a box at the beginning of your meal and put the rest of your entree in it so you are not tempted to eat it. 4. If calories are listed on the menu, choose the lower calorie options. 5. Choose dishes that include vegetables, fruits, whole grains, low-fat dairy products, and lean protein. 6. Choose items that are boiled, broiled, grilled, or steamed. Stay away from items that are buttered, battered, fried, or served with cream sauce. Items labeled "crispy" are usually fried, unless stated otherwise. 7. Choose water, low-fat milk, unsweetened iced tea, or other drinks without added sugar. If you want an alcoholic beverage, choose a lower calorie option such as a glass of wine or light beer. 8. Ask for dressings, sauces, and syrups on the side. These are usually high in calories, so you should limit the amount you eat. 9. If you want a salad, choose a garden salad and ask for grilled meats. Avoid extra toppings like bacon, cheese, or fried items. Ask for the dressing on the side, or ask for olive oil and vinegar or lemon to use as dressing. 10. Estimate how many servings of a food you are given. For example,  a serving of cooked rice is  cup or about the size of half a baseball. Knowing serving sizes will help you be aware of how much food you are eating at restaurants. The list below tells you how big or small some common portion sizes are based on everyday objects: ? 1 oz--4 stacked dice. ? 3 oz--1 deck of cards. ? 1 tsp--1 die. ? 1 Tbsp-- a ping-pong ball. ? 2 Tbsp--1 ping-pong ball. ?  cup-- baseball. ? 1 cup--1 baseball. Summary  Calorie counting means keeping track of how many calories you eat and drink each day. If you eat fewer calories than your body needs, you should lose weight.  A healthy amount of weight to lose per week is usually 1-2 lb (0.5-0.9 kg). This usually means reducing your daily calorie intake by 500-750 calories.  The number of calories in a food can be found on a Nutrition Facts label. If a food does not have a Nutrition Facts label, try to look up the calories online or ask your dietitian for help.  Use your calories on foods and drinks that will fill you up, and not on foods and drinks that will leave you hungry.  Use smaller plates, glasses, and bowls to prevent overeating. This information is not intended to replace advice given to you by your health care provider. Make sure you discuss any questions you have with your health care provider.  Document Revised: 04/05/2018 Document Reviewed: 06/16/2016 Elsevier Patient Education  2020 [employer].  ---------------------------------------------------------------------------------------------------------------------  Exercising to [employer] Exercise is structured, repetitive physical activity to improve fitness and health. Getting regular exercise is important for everyone. It is especially important if you are overweight. Being overweight increases your risk of heart disease, stroke, diabetes, high blood pressure, and several types of cancer. Reducing your calorie intake and exercising can help you lose  weight. Exercise is usually categorized as moderate or vigorous intensity. To lose weight, most people need to do a certain amount of moderate-intensity or vigorous-intensity exercise each week. Moderate-intensity  exercise  Moderate-intensity exercise is any activity that gets you moving enough to burn at least three times more energy (calories) than if you were sitting. Examples of moderate exercise include:  Walking a mile in 15 minutes.  Doing light yard work.  Biking at an easy pace. Most people should get at least 150 minutes (2 hours and 30 minutes) a week of moderate-intensity exercise to maintain their body weight. Vigorous-intensity exercise Vigorous-intensity exercise is any activity that gets you moving enough to burn at least six times more calories than if you were sitting. When you exercise at this intensity, you should be working hard enough that you are not able to carry on a conversation. Examples of vigorous exercise include:  Running.  Playing a team sport, such as football, basketball, and soccer.  Jumping rope. Most people should get at least 75 minutes (1 hour and 15 minutes) a week of vigorous-intensity exercise to maintain their body weight. How can exercise affect me? When you exercise enough to burn more calories than you eat, you lose weight. Exercise also reduces body fat and builds muscle. The more muscle you have, the more calories you burn. Exercise also:  Improves mood.  Reduces stress and tension.  Improves your overall fitness, flexibility, and endurance.  Increases bone strength. The amount of exercise you need to lose weight depends on:  Your age.  The type of exercise.  Any health conditions you have.  Your overall physical ability. Talk to your health care provider about how much exercise you need and what types of activities are safe for you. What actions can I take to lose weight? Nutrition  7. Make changes to your diet as told by  your health care provider or diet and nutrition specialist (dietitian). This may include: ? Eating fewer calories. ? Eating more protein. ? Eating less unhealthy fats. ? Eating a diet that includes fresh fruits and vegetables, whole grains, low-fat dairy products, and lean protein. ? Avoiding foods with added fat, salt, and sugar. 8. Drink plenty of water while you exercise to prevent dehydration or heat stroke. Activity 11. Choose an activity that you enjoy and set realistic goals. Your health care provider can help you make an exercise plan that works for you. 12. Exercise at a moderate or vigorous intensity most days of the week. ? The intensity of exercise may vary from person to person. You can tell how intense a workout is for you by paying attention to your breathing and heartbeat. Most people will notice their breathing and heartbeat get faster with more intense exercise. 13. Do resistance training twice each week, such as: ? Push-ups. ? Sit-ups. ? Lifting weights. ? Using resistance bands. 14. Getting short amounts of exercise can be just as helpful as long structured periods of exercise. If you have trouble finding time to exercise, try to include exercise in your daily routine. ? Get up, stretch, and walk around every 30 minutes throughout the day. ? Go for a walk during your lunch break. ? Park your car farther away from your destination. ? If you take public transportation, get off one stop early and walk the rest of the way. ? Make phone calls while standing up and walking around. ? Take the stairs instead of elevators or escalators. 15. Wear comfortable clothes and shoes with good support. 16. Do not exercise so much that you hurt yourself, feel dizzy, or get very short of breath. Where to find more information  [place] Department of  Health and Human Services: [employer]  Centers for Disease Control and Prevention (CDC): http://www.wolf.info/ Contact a health care provider:  Before  starting a new exercise program.  If you have questions or concerns about your weight.  If you have a medical problem that keeps you from exercising. Get help right away if you have any of the following while exercising:  Injury.  Dizziness.  Difficulty breathing or shortness of breath that does not go away when you stop exercising.  Chest pain.  Rapid heartbeat. Summary  Being overweight increases your risk of heart disease, stroke, diabetes, high blood pressure, and several types of cancer.  Losing weight happens when you burn more calories than you eat.  Reducing the amount of calories you eat in addition to getting regular moderate or vigorous exercise each week helps you lose weight. This information is not intended to replace advice given to you by your health care provider. Make sure you discuss any questions you have with your health care provider.  Document Revised: 07/30/2017 Document Reviewed: 07/30/2017 Elsevier Patient Education  2020 [employer].

## 2020-03-30 NOTE — Assessment & Plan Note (Signed)
Pt did not  Use cpap  Yesterday  , he  Was advised to use it daily

## 2020-03-30 NOTE — Assessment & Plan Note (Signed)
-   I instructed the patient to stop smoking and provided them with smoking cessation materials.  - I informed the patient that smoking puts them at increased risk for cancer, COPD, hypertension, and more.  - Informed the patient to seek help if they begin to have trouble breathing, develop chest pain, start to cough up blood, feel faint, or pass out.  

## 2020-03-30 NOTE — Assessment & Plan Note (Signed)
-   I encouraged the patient to lose weight.  - I educated them on making healthy dietary choices including eating more fruits and vegetables and less fried foods. - I encouraged the patient to exercise more, and educated on the benefits of exercise including weight loss,  

## 2020-03-30 NOTE — Assessment & Plan Note (Signed)
ch prooblem  , advised  To lose wt  , ref to ortho

## 2020-03-30 NOTE — Assessment & Plan Note (Signed)
-   Today, the patient's blood pressure is not well managed on amlodapine  And  Diuretics and  cpap. - The patient will continue the current treatment regimen.  - I encouraged the patient to eat a low-sodium diet to help control blood pressure. - I encouraged the patient to live an active lifestyle and complete activities that increases heart rate to 85% target heart rate at least 5 times per week for one hour.

## 2020-03-30 NOTE — Progress Notes (Signed)
Established Patient Office Visit  SUBJECTIVE:  Subjective  Patient ID: David Rangel, male    DOB: 1970-10-02  Age: 49 y.o. MRN: [id number]  CC:  Chief Complaint  Patient presents with  . morbid obesity    follow up on weight loss     HPI David Rangel is a 49 y.o. male presenting today for a weight loss follow up.  He is having some left shoulder pain, but he got a cortisone injection at [facility] yesterday. He continues to smoke 4-5 cigars per week. He also didn't use his CPAP last night which may explain his higher blood pressure today.  He has not been taking his fluid pill regularly.    Past Medical History:  Diagnosis Date  . Asthma   . Elevated PSA   . Hypertension   . Prostate cancer ([place]) 2019  . Sleep apnea     Past Surgical History:  Procedure Laterality Date  . GASTRIC BYPASS  2009  . PROSTATE BIOPSY  2019   ?postitive    Family History  Problem Relation Age of Onset  . Prostate cancer Paternal Uncle   . Prostate cancer Maternal Uncle   . Prostate cancer Father   . Prostate cancer Paternal Grandfather   . Bladder Cancer Neg Hx   . Kidney cancer Neg Hx     Social History   Socioeconomic History  . Marital status: Married    Spouse name: Not on file  . Number of children: Not on file  . Years of education: Not on file  . Highest education level: Not on file  Occupational History  . Not on file  Tobacco Use  . Smoking status: Current Some Day Smoker    Types: Cigars  . Smokeless tobacco: Never Used  . Tobacco comment: quit in 09/2015  Vaping Use  . Vaping Use: Never used  Substance and Sexual Activity  . Alcohol use: Yes    Alcohol/week: 2.0 standard drinks    Types: 2 Cans of beer per week  . Drug use: No  . Sexual activity: Yes    Birth control/protection: None  Other Topics Concern  . Not on file  Social History Narrative  . Not on file   Social Determinants of Health   Financial Resource Strain:   . Difficulty of Paying Living  Expenses: Not on file  Food Insecurity:   . Worried About Charity fundraiser in the Last Year: Not on file  . Ran Out of Food in the Last Year: Not on file  Transportation Needs:   . Lack of Transportation (Medical): Not on file  . Lack of Transportation (Non-Medical): Not on file  Physical Activity:   . Days of Exercise per Week: Not on file  . Minutes of Exercise per Session: Not on file  Stress:   . Feeling of Stress : Not on file  Social Connections:   . Frequency of Communication with Friends and Family: Not on file  . Frequency of Social Gatherings with Friends and Family: Not on file  . Attends Religious Services: Not on file  . Active Member of Clubs or Organizations: Not on file  . Attends Archivist Meetings: Not on file  . Marital Status: Not on file  Intimate Partner Violence:   . Fear of Current or Ex-Partner: Not on file  . Emotionally Abused: Not on file  . Physically Abused: Not on file  . Sexually Abused: Not on file     Current  Outpatient Medications:  .  albuterol (PROVENTIL HFA;VENTOLIN HFA) 108 (90 Base) MCG/ACT inhaler, Inhale 1-2 puffs into the lungs every 6 (six) hours as needed for wheezing or shortness of breath., Disp: , Rfl:  .  amLODipine (NORVASC) 10 MG tablet, Take 10 mg by mouth daily., Disp: , Rfl: 5 .  hydrochlorothiazide (HYDRODIURIL) 25 MG tablet, Take 25 mg by mouth daily., Disp: , Rfl:  .  losartan (COZAAR) 100 MG tablet, Take 200 mg by mouth daily., Disp: , Rfl:  .  Multiple Vitamin (MULTIVITAMIN WITH MINERALS) TABS tablet, Take 1 tablet by mouth daily., Disp: , Rfl:  .  phentermine 15 MG capsule, Take 1 capsule (15 mg total) by mouth every morning., Disp: 30 capsule, Rfl: 1 .  tadalafil (ADCIRCA/CIALIS) 20 MG tablet, Take 20 mg by mouth once a week., Disp: , Rfl: 4   Allergies  Allergen Reactions  . Cashew Nut Oil Anaphylaxis    ROS Review of Systems  Constitutional: Negative.   HENT: Negative.   Eyes: Negative.     Respiratory: Negative.   Cardiovascular: Negative.   Gastrointestinal: Negative.   Endocrine: Negative.   Genitourinary: Negative.   Musculoskeletal: Negative.   Skin: Negative.   Allergic/Immunologic: Negative.   Neurological: Negative.   Hematological: Negative.   Psychiatric/Behavioral: Negative.   All other systems reviewed and are negative.    OBJECTIVE:    Physical Exam Vitals reviewed.  Constitutional:      Appearance: Normal appearance. He is obese.  HENT:     Mouth/Throat:     Mouth: Mucous membranes are moist.  Eyes:     Pupils: Pupils are equal, round, and reactive to light.  Neck:     Vascular: No carotid bruit.  Cardiovascular:     Rate and Rhythm: Normal rate and regular rhythm.     Pulses: Normal pulses.     Heart sounds: Normal heart sounds.  Pulmonary:     Effort: Pulmonary effort is normal.     Breath sounds: Normal breath sounds.  Abdominal:     General: Bowel sounds are normal.     Palpations: Abdomen is soft. There is no hepatomegaly, splenomegaly or mass.     Tenderness: There is no abdominal tenderness.     Hernia: No hernia is present.  Musculoskeletal:     Cervical back: Neck supple.     Right lower leg: No edema.     Left lower leg: No edema.  Skin:    Findings: No rash.  Neurological:     Mental Status: He is alert and oriented to person, place, and time.     Motor: No weakness.  Psychiatric:        Mood and Affect: Mood normal.        Behavior: Behavior normal.     BP (!) 172/89   Pulse 83   Ht 6\' 2"  (1.88 m)   Wt (!) 418 lb 6.4 oz (189.8 kg)   BMI 53.72 kg/m  Wt Readings from Last 3 Encounters:  03/30/20 (!) 418 lb 6.4 oz (189.8 kg)  03/09/20 (!) 420 lb (190.5 kg)  03/02/20 (!) 425 lb 4.8 oz (192.9 kg)    Health Maintenance Due  Topic Date Due  . Hepatitis C Screening  Never done  . COVID-19 Vaccine (1) Never done  . TETANUS/TDAP  Never done  . COLONOSCOPY  Never done  . INFLUENZA VACCINE  02/29/2020    There  are no preventive care reminders to display for this patient.  CBC Latest Ref Rng & Units 01/19/2020 04/25/2018 04/24/2018  WBC 3.8 - 10.8 Thousand/uL 5.3 9.5 17.0(H)  Hemoglobin 13.2 - 17.1 g/dL 14.3 12.2(L) 11.3(L)  Hematocrit 38 - 50 % 42.6 37.3(L) 34.0(L)  Platelets 140 - 400 Thousand/uL 189 277 269   CMP Latest Ref Rng & Units 01/19/2020 04/25/2018 04/24/2018  Glucose 65 - 99 mg/dL 79 103(H) 108(H)  BUN 7 - 25 mg/dL 11 10 11   Creatinine 0.70 - 1.33 mg/dL 1.19 0.99 1.21  Sodium 135 - 146 mmol/L 141 140 138  Potassium 3.5 - 5.3 mmol/L 4.9 3.8 3.2(L)  Chloride 98 - 110 mmol/L 107 103 107  CO2 20 - 32 mmol/L 20 27 25   Calcium 8.6 - 10.3 mg/dL 9.5 8.6(L) 8.1(L)  Total Protein 6.1 - 8.1 g/dL 7.4 - -  Total Bilirubin 0.2 - 1.2 mg/dL 0.4 - -  Alkaline Phos 38 - 126 U/L - - -  AST 10 - 35 U/L 19 - -  ALT 9 - 46 U/L 18 - -    Lab Results  Component Value Date   TSH 1.14 01/19/2020   Lab Results  Component Value Date   ALBUMIN 3.6 04/24/2018   ANIONGAP 10 04/25/2018   No results found for: CHOL, HDL, LDLCALC, CHOLHDL No results found for: TRIG No results found for: HGBA1C    ASSESSMENT & PLAN:   Problem List Items Addressed This Visit      Cardiovascular and Mediastinum   HTN (hypertension)    - Today, the patient's blood pressure is not well managed on amlodapine  And  Diuretics and  cpap. - The patient will continue the current treatment regimen.  - I encouraged the patient to eat a low-sodium diet to help control blood pressure. - I encouraged the patient to live an active lifestyle and complete activities that increases heart rate to 85% target heart rate at least 5 times per week for one hour.            Respiratory   OSA on CPAP    Pt did not  Use cpap  Yesterday  , he  Was advised to use it daily        Musculoskeletal and Integument   Arthritis of right knee    ch prooblem  , advised  To lose wt  , ref to ortho        Other   Morbid obesity with BMI of  50.0-59.9, adult ([place]) - Primary    - I encouraged the patient to lose weight.  - I educated them on making healthy dietary choices including eating more fruits and vegetables and less fried foods. - I encouraged the patient to exercise more, and educated on the benefits of exercise including weight loss,.        Tobacco abuse counseling    - I instructed the patient to stop smoking and provided them with smoking cessation materials.  - I informed the patient that smoking puts them at increased risk for cancer, COPD, hypertension, and more.  - Informed the patient to seek help if they begin to have trouble breathing, develop chest pain, start to cough up blood, feel faint, or pass out.           No orders of the defined types were placed in this encounter.   Follow-up: No follow-ups on file.    Dr. Jane Canary [facility] [address] [place], [place] [address]   By signing my name below,  I, [employer], attest that this documentation has been prepared under the direction and in the presence of Cletis Athens, MD. Electronically Signed: Cletis Athens, MD 03/30/20, 9:15 AM   I personally performed the services described in this documentation, which was SCRIBED in my presence. The recorded information has been reviewed and considered accurate. It has been edited as necessary during review. Cletis Athens, MD

## 2020-04-08 ENCOUNTER — Other Ambulatory Visit: Payer: BC Managed Care – PPO

## 2020-04-08 ENCOUNTER — Other Ambulatory Visit: Payer: Self-pay

## 2020-04-08 DIAGNOSIS — Z8546 Personal history of malignant neoplasm of prostate: Secondary | ICD-10-CM | POA: Diagnosis not present

## 2020-04-09 LAB — PSA: Prostate Specific Ag, Serum: 10.5 ng/mL — ABNORMAL HIGH (ref 0.0–4.0)

## 2020-04-15 ENCOUNTER — Other Ambulatory Visit: Payer: Self-pay

## 2020-04-15 ENCOUNTER — Encounter: Payer: Self-pay | Admitting: Urology

## 2020-04-15 ENCOUNTER — Ambulatory Visit (INDEPENDENT_AMBULATORY_CARE_PROVIDER_SITE_OTHER): Payer: BC Managed Care – PPO | Admitting: Urology

## 2020-04-15 VITALS — BP 157/91 | HR 73 | Ht 74.0 in | Wt >= 6400 oz

## 2020-04-15 DIAGNOSIS — C61 Malignant neoplasm of prostate: Secondary | ICD-10-CM | POA: Diagnosis not present

## 2020-04-15 NOTE — Progress Notes (Signed)
   04/15/2020 2:54 PM   Melton Alar November 09, 1970 [id number]  Reason for visit: Follow up prostate cancer  HPI: Milton Ferguson is a 49 year old morbidly obese(BMI 21) African-American male with a strong family history of prostate cancer who was diagnosed with a single core of Gleason score 3+3 =6 prostate cancer all the way back in 2008 by Dr. Yves Dill. He was then lost to follow-up. His PSA had risen to 7.5 in 2019 and he underwent re-biopsy on 03/27/2018. This showed a single core of Gleason score 3+3=6 disease with5% max core involvement. He is in the very low risk category.  He has a history of sepsis after prostate biopsy.  He denies any problems over the last year.  PSA has bumped to 10.5 from 7.1 last year. We have deferred DRE's with his morbid obesity and inability to palpate the prostate.  We had a long discussion about his very low risk prostate cancer and the need for ongoing PSA monitoring, as well as the variation in PSA.  We discussed options with his slight rise in the PSA including repeat PSA in 3 months, prostate MRI, or prostate biopsy.  He would like to pursue repeat PSA in 3 months which I think is very reasonable with his morbid obesity, comorbidities, and history of sepsis with prostate biopsy.  RTC 3 months with PSA reflex to free prior If remains significantly elevated consider prostate MRI   [name], MD  [facility] [address], [place], [place] [address] [phone]

## 2020-04-15 NOTE — Patient Instructions (Signed)
Please avoid sexual activity, bike riding, lawn mower riding at least 3 days prior to having your PSA level drawn.

## 2020-04-29 ENCOUNTER — Ambulatory Visit: Payer: BC Managed Care – PPO | Admitting: Internal Medicine

## 2020-07-06 ENCOUNTER — Other Ambulatory Visit: Payer: Self-pay | Admitting: Internal Medicine

## 2020-07-15 ENCOUNTER — Other Ambulatory Visit: Payer: BC Managed Care – PPO

## 2020-07-15 ENCOUNTER — Other Ambulatory Visit: Payer: Self-pay

## 2020-07-15 DIAGNOSIS — C61 Malignant neoplasm of prostate: Secondary | ICD-10-CM | POA: Diagnosis not present

## 2020-07-15 DIAGNOSIS — Z8546 Personal history of malignant neoplasm of prostate: Secondary | ICD-10-CM | POA: Diagnosis not present

## 2020-07-16 LAB — PSA TOTAL (REFLEX TO FREE): Prostate Specific Ag, Serum: 11.7 ng/mL — ABNORMAL HIGH (ref 0.0–4.0)

## 2020-07-21 ENCOUNTER — Other Ambulatory Visit: Payer: Self-pay

## 2020-07-21 ENCOUNTER — Ambulatory Visit (INDEPENDENT_AMBULATORY_CARE_PROVIDER_SITE_OTHER): Payer: BC Managed Care – PPO | Admitting: Urology

## 2020-07-21 ENCOUNTER — Encounter: Payer: Self-pay | Admitting: Urology

## 2020-07-21 VITALS — BP 119/88 | HR 88 | Ht 74.0 in | Wt >= 6400 oz

## 2020-07-21 DIAGNOSIS — Z113 Encounter for screening for infections with a predominantly sexual mode of transmission: Secondary | ICD-10-CM | POA: Diagnosis not present

## 2020-07-21 DIAGNOSIS — C61 Malignant neoplasm of prostate: Secondary | ICD-10-CM

## 2020-07-21 LAB — URINALYSIS, COMPLETE
Bilirubin, UA: NEGATIVE
Glucose, UA: NEGATIVE
Leukocytes,UA: NEGATIVE
Nitrite, UA: NEGATIVE
Protein,UA: NEGATIVE
RBC, UA: NEGATIVE
Specific Gravity, UA: >1.03 — ABNORMAL HIGH (ref 1.005–1.030)
Urobilinogen, Ur: 1 mg/dL (ref 0.2–1.0)
pH, UA: 5.5 (ref 5.0–7.5)

## 2020-07-21 LAB — MICROSCOPIC EXAMINATION: Epithelial Cells (non renal): >10 /hpf — AB (ref 0–10)

## 2020-07-21 NOTE — Progress Notes (Signed)
   07/21/2020 5:24 PM   David Rangel 1971/06/16 [id number]  Reason for visit: Follow up prostate cancer  HPI: He is a 79 year oldmorbidlyobese(BMI 19) African-American male with a strong family history of prostate cancer who was diagnosed with a single core of Gleason score 3+3 =6 prostate cancerall the way backin 2008by Dr. Yves Dill. He was then lost to follow-up. His PSA had risen to 7.5 in 2019 and he underwent re-biopsy on 03/27/2018. This showed a single core of Gleason score 3+3=6 disease with5%max core involvement. He is in the very low risk category.  He has a history of sepsis after prostate biopsy.  PSA has bumped to 10.5 and September 2021 from 7.1 last year.We have deferred DRE's with his morbid obesity and inability to palpate the prostate.  He opted for repeat PSA in 3 months.  PSA 07/15/2020 remains elevated at 11.7.  Notably, he reports some scrotal discomfort, and feels he could potentially have a UTI.  He has also had unprotected sex multiple times over the last few months and thinks he could also have an STD.  On exam today there are no penile lesions or discharge, normal-appearing phallus.  We discussed the possible impact of infection on the PSA.  I think it is reasonable to check a urinalysis and culture, as well as STD testing today to rule out infection prior to pursuing more aggressive work-up of his elevated PSA.  We discussed that if urine culture and STD testing is negative, I would recommend a prostate MRI to evaluate for any suspicious lesions, which would then likely need to be biopsied in [place].  Call with urine culture and STD results-if negative needs prostate MRI for persistently rising PSA with history of low risk prostate cancer on active surveillance   [name], MD  [facility] [address] [place], [place] [address] [phone]

## 2020-07-24 LAB — GC/CHLAMYDIA PROBE AMP
Chlamydia trachomatis, NAA: NEGATIVE
Neisseria Gonorrhoeae by PCR: NEGATIVE

## 2020-07-27 ENCOUNTER — Other Ambulatory Visit: Payer: Self-pay | Admitting: Urology

## 2020-07-27 ENCOUNTER — Telehealth: Payer: Self-pay

## 2020-07-27 DIAGNOSIS — C61 Malignant neoplasm of prostate: Secondary | ICD-10-CM

## 2020-07-27 LAB — CULTURE, URINE COMPREHENSIVE

## 2020-07-27 NOTE — Telephone Encounter (Signed)
Called pt informed him of the information below. Pt gave verbal understanding.  

## 2020-07-27 NOTE — Telephone Encounter (Signed)
-----   Message from Sondra Come, MD sent at 07/27/2020  8:44 AM EST ----- STD testing and urine culture negative, needs prostate MRI for further evaluation with his history of low risk prostate cancer. I will order MRI, thanks  Legrand Rams, MD 07/27/2020

## 2020-08-18 ENCOUNTER — Other Ambulatory Visit: Payer: Self-pay | Admitting: Internal Medicine

## 2020-08-19 ENCOUNTER — Other Ambulatory Visit: Payer: Self-pay

## 2020-08-19 ENCOUNTER — Ambulatory Visit
Admission: RE | Admit: 2020-08-19 | Discharge: 2020-08-19 | Disposition: A | Discharge status: Home / Self Care | Payer: BC Managed Care – PPO | Source: Ambulatory Visit | Attending: Urology | Admitting: Urology

## 2020-08-19 DIAGNOSIS — N402 Nodular prostate without lower urinary tract symptoms: Secondary | ICD-10-CM | POA: Diagnosis not present

## 2020-08-19 DIAGNOSIS — C61 Malignant neoplasm of prostate: Secondary | ICD-10-CM | POA: Insufficient documentation

## 2020-08-19 MED ORDER — GADOBUTROL 1 MMOL/ML IV SOLN
10.0000 mL | Freq: Once | INTRAVENOUS | Status: AC | PRN
  Administered 2020-08-19: 10 mL via INTRAVENOUS

## 2020-09-02 ENCOUNTER — Other Ambulatory Visit: Payer: Self-pay | Admitting: Internal Medicine

## 2020-09-07 ENCOUNTER — Other Ambulatory Visit: Payer: Self-pay

## 2020-09-07 ENCOUNTER — Ambulatory Visit (INDEPENDENT_AMBULATORY_CARE_PROVIDER_SITE_OTHER): Payer: BC Managed Care – PPO | Admitting: Urology

## 2020-09-07 ENCOUNTER — Encounter: Payer: Self-pay | Admitting: Urology

## 2020-09-07 VITALS — BP 198/107 | HR 78 | Ht 74.0 in | Wt >= 6400 oz

## 2020-09-07 DIAGNOSIS — C61 Malignant neoplasm of prostate: Secondary | ICD-10-CM | POA: Diagnosis not present

## 2020-09-07 NOTE — Patient Instructions (Signed)
Prostate Cancer Screening  Prostate cancer screening is a test that is done to check for the presence of prostate cancer in men. The prostate gland is a walnut-sized gland that is located below the bladder and in front of the rectum in males. The function of the prostate is to add fluid to semen during ejaculation. Prostate cancer is the second most common type of cancer in men. Who should have prostate cancer screening?  Screening recommendations vary based on age and other risk factors. Screening is recommended if:  You are older than age 55. If you are age 55-69, talk with your health care provider about your need for screening and how often screening should be done. Because most prostate cancers are slow growing and will not cause death, screening is generally reserved in this age group for men who have a 10-15-year life expectancy.  You are younger than age 55, and you have these risk factors: ? Being a black male or a male of African descent. ? Having a father, brother, or uncle who has been diagnosed with prostate cancer. The risk is higher if your family member's cancer occurred at an early age. Screening is not recommended if:  You are younger than age 40.  You are between the ages of 40 and 54 and you have no risk factors.  You are 70 years of age or older. At this age, the risks that screening can cause are greater than the benefits that it may provide. If you are at high risk for prostate cancer, your health care provider may recommend that you have screenings more often or that you start screening at a younger age. How is screening for prostate cancer done? The recommended prostate cancer screening test is a blood test called the prostate-specific antigen (PSA) test. PSA is a protein that is made in the prostate. As you age, your prostate naturally produces more PSA. Abnormally high PSA levels may be caused by:  Prostate cancer.  An enlarged prostate that is not caused by cancer  (benign prostatic hyperplasia, BPH). This condition is very common in older men.  A prostate gland infection (prostatitis). Depending on the PSA results, you may need more tests, such as:  A physical exam to check the size of your prostate gland.  Blood and imaging tests.  A procedure to remove tissue samples from your prostate gland for testing (biopsy). What are the benefits of prostate cancer screening?  Screening can help to identify cancer at an early stage, before symptoms start and when the cancer can be treated more easily.  There is a small chance that screening may lower your risk of dying from prostate cancer. The chance is small because prostate cancer is a slow-growing cancer, and most men with prostate cancer die from a different cause. What are the risks of prostate cancer screening? The main risk of prostate cancer screening is diagnosing and treating prostate cancer that would never have caused any symptoms or problems. This is called overdiagnosisand overtreatment. PSA screening cannot tell you if your PSA is high due to cancer or a different cause. A prostate biopsy is the only procedure to diagnose prostate cancer. Even the results of a biopsy may not tell you if your cancer needs to be treated. Slow-growing prostate cancer may not need any treatment other than monitoring, so diagnosing and treating it may cause unnecessary stress or other side effects. A prostate biopsy may also cause:  Infection or fever.  A false negative. This is   a result that shows that you do not have prostate cancer when you actually do have prostate cancer. Questions to ask your health care provider  When should I start prostate cancer screening?  What is my risk for prostate cancer?  How often do I need screening?  What type of screening tests do I need?  How do I get my test results?  What do my results mean?  Do I need treatment? Where to find more information  The American Cancer  Society: www.cancer.org  American Urological Association: www.auanet.org Contact a health care provider if:  You have difficulty urinating.  You have pain when you urinate or ejaculate.  You have blood in your urine or semen.  You have pain in your back or in the area of your prostate. Summary  Prostate cancer is a common type of cancer in men. The prostate gland is located below the bladder and in front of the rectum. This gland adds fluid to semen during ejaculation.  Prostate cancer screening may identify cancer at an early stage, when the cancer can be treated more easily.  The prostate-specific antigen (PSA) test is the recommended screening test for prostate cancer.  Discuss the risks and benefits of prostate cancer screening with your health care provider. If you are age 51 or older, the risks that screening can cause are greater than the benefits that it may provide. This information is not intended to replace advice given to you by your health care provider. Make sure you discuss any questions you have with your health care provider. Document Revised: 11/07/2019 Document Reviewed: 02/27/2019 Elsevier Patient Education  [place].

## 2020-09-07 NOTE — Progress Notes (Signed)
   09/07/2020 11:57 AM   David Rangel May 21, 1971 [id number]  Reason for visit: Follow up low risk prostate cancer, rising PSA  HPI: He is a53 year oldmorbidlyobese(BMI 54)African-American male with a strong family history of prostate cancer who was diagnosed with a single core of Gleason score 3+3=6 prostate cancerall the way backin 2008by Dr. Yves Dill. He was then lost to follow-up. His PSA had risen to 7.5in 2019and he underwent re-biopsy on 03/27/2018. This showed a single core of Gleason score 3+3=6 disease with5%max core involvement. He is in the very low risk category.He has a history of sepsis after prostate biopsy.  PSA bumped to 10.5 in September 2021 from 7.1 last year.We have deferred DRE's with his morbid obesity and inability to palpate the prostate.  PSA 07/15/2020 remained elevated at 11.7.  He opted for a prostate MRI.  He remains sexually active. He denies significant urinary symptoms except for nocturia if he is non-compliant with CPAP machine.  Prostate MRI on 08/20/2020 showed a 32 g prostate with no evidence of high risk prostate cancer.  There were 3 indeterminate PI-RADS 3 lesions, with the largest measuring 9 mm.  No evidence of transcapsular spread, SV involvement, neurovascular bundle involvement, or pelvic adenopathy.  Reviewed his MRI results and PSA trend at length.  We reviewed options including repeat PSA in 3-6 months, MRI fusion guided biopsy of PI-RADS 3 lesions and systematic biopsy, or meeting with radiation oncology to discuss definitive treatment.  Risks and benefits were reviewed extensively.  He would like to repeat PSA in 6 months.  He is very averse to a review prostate biopsy with his history of sepsis after biopsy which is understandable.  RTC 6 months with PSA prior  [name], MD  [facility] [address] [place], [place] [address] [phone]

## 2020-09-23 DIAGNOSIS — G4733 Obstructive sleep apnea (adult) (pediatric): Secondary | ICD-10-CM | POA: Diagnosis not present

## 2020-10-20 ENCOUNTER — Other Ambulatory Visit: Payer: Self-pay | Admitting: Internal Medicine

## 2020-12-29 DIAGNOSIS — G4733 Obstructive sleep apnea (adult) (pediatric): Secondary | ICD-10-CM | POA: Diagnosis not present

## 2021-02-07 ENCOUNTER — Other Ambulatory Visit: Payer: Self-pay | Admitting: Internal Medicine

## 2021-02-22 DIAGNOSIS — H1132 Conjunctival hemorrhage, left eye: Secondary | ICD-10-CM | POA: Diagnosis not present

## 2021-02-24 DIAGNOSIS — H1132 Conjunctival hemorrhage, left eye: Secondary | ICD-10-CM | POA: Diagnosis not present

## 2021-03-04 ENCOUNTER — Other Ambulatory Visit: Payer: BC Managed Care – PPO

## 2021-03-04 ENCOUNTER — Other Ambulatory Visit: Payer: Self-pay

## 2021-03-04 DIAGNOSIS — C61 Malignant neoplasm of prostate: Secondary | ICD-10-CM

## 2021-03-05 LAB — PSA: Prostate Specific Ag, Serum: 10.7 ng/mL — ABNORMAL HIGH (ref 0.0–4.0)

## 2021-03-09 ENCOUNTER — Other Ambulatory Visit: Payer: Self-pay

## 2021-03-09 ENCOUNTER — Ambulatory Visit (INDEPENDENT_AMBULATORY_CARE_PROVIDER_SITE_OTHER): Payer: BC Managed Care – PPO | Admitting: Urology

## 2021-03-09 ENCOUNTER — Encounter: Payer: Self-pay | Admitting: Urology

## 2021-03-09 VITALS — Ht 74.0 in | Wt >= 6400 oz

## 2021-03-09 DIAGNOSIS — C61 Malignant neoplasm of prostate: Secondary | ICD-10-CM | POA: Diagnosis not present

## 2021-03-09 NOTE — Progress Notes (Signed)
   03/09/2021 4:07 PM   Melton Alar 03/08/1971 [id number]  Reason for visit: Follow up very low risk prostate cancer  HPI: He is a 50 year old morbidly obese(BMI 13) African-American male with a strong family history of prostate cancer who was diagnosed with a single core of Gleason score 3+3=6 prostate cancer all the way back in 2008 by Dr. Yves Dill.  He was then lost to follow-up.  His PSA had risen to 7.5 in 2019 and he underwent re-biopsy on 03/27/2018. This showed a single core of Gleason score 3+3=6 disease with 5% max core involvement.  He is in the very low risk category.  He has a history of sepsis after prostate biopsy.   PSA bumped to 10.5 in September 2021 from 7.1 prior we have deferred DRE's with his morbid obesity and inability to palpate the prostate.  PSA 07/15/2020 remained elevated at 11.7.  He opted for a prostate MRI.   He remains sexually active. He denies significant urinary symptoms except for nocturia if he is non-compliant with CPAP machine.   Prostate MRI on 08/20/2020 showed a 32 g prostate with no evidence of high risk prostate cancer.  There were 3 indeterminate PI-RADS 3 lesions, with the largest measuring 9 mm.  No evidence of transcapsular spread, SV involvement, neurovascular bundle involvement, or pelvic adenopathy.   At that time he opted for a repeat PSA in 6 months and deferred MRI fusion guided biopsy with his history of sepsis after biopsy.  Most recent PSA on 03/04/2021 was stable at 10.7 from 11.7 7 months ago, and 10.5 1-year ago.  We again discussed options including fusion guided biopsy, 4K score, genomic tissue evaluation, or repeat PSA in 1 year.  He opts for PSA in 1 year, and with his reassuring MRI and stable PSA, as well as his comorbidities I think this is reasonable.  We discussed the low, but non-zero, risk of developing metastatic prostate cancer by deferring further evaluation and work-up at this time.  RTC 1 year PSA reflex to free  prior Consider 4K score or Oncotype GPS score in the future    [name], [facility] [address], [place], [place] [address] [phone]

## 2021-04-08 DIAGNOSIS — G4733 Obstructive sleep apnea (adult) (pediatric): Secondary | ICD-10-CM | POA: Diagnosis not present

## 2021-04-10 ENCOUNTER — Encounter: Payer: Self-pay | Admitting: Emergency Medicine

## 2021-04-10 ENCOUNTER — Other Ambulatory Visit: Payer: Self-pay

## 2021-04-10 ENCOUNTER — Ambulatory Visit
Admission: EM | Admit: 2021-04-10 | Discharge: 2021-04-10 | Disposition: A | Discharge status: Home / Self Care | Payer: BC Managed Care – PPO | Attending: Emergency Medicine | Admitting: Emergency Medicine

## 2021-04-10 DIAGNOSIS — K029 Dental caries, unspecified: Secondary | ICD-10-CM

## 2021-04-10 DIAGNOSIS — I10 Essential (primary) hypertension: Secondary | ICD-10-CM | POA: Diagnosis not present

## 2021-04-10 DIAGNOSIS — K047 Periapical abscess without sinus: Secondary | ICD-10-CM | POA: Diagnosis not present

## 2021-04-10 MED ORDER — AMOXICILLIN 875 MG PO TABS
875.0000 mg | ORAL_TABLET | Freq: Two times a day (BID) | ORAL | 0 refills | Status: AC
Start: 2021-04-10 — End: 2021-04-17

## 2021-04-10 NOTE — ED Provider Notes (Signed)
David Rangel    CSN: [id number] Arrival date & time: 04/10/21  1208      History   Chief Complaint Chief Complaint  Patient presents with   Dental Problem    HPI David Rangel is a 50 y.o. male.  Patient presents with toothache x5 days.  He states his tooth broke 2 weeks ago.  He has not seen a dentist.  He denies fever, chills, difficulty swallowing, chest pain, shortness of breath, or other symptoms.  No treatment attempted at home.  His medical history includes prostate cancer, hypertension, morbid obesity, tobacco abuse, gastric bypass.  Patient states he has not taken his blood pressure medications today.  The history is provided by the patient and medical records.   Past Medical History:  Diagnosis Date   Asthma    Elevated PSA    Hypertension    Prostate cancer ([place]) 2019   Sleep apnea     Patient Active Problem List   Diagnosis Date Noted   Arthritis of right knee 03/02/2020   Prepatellar bursitis of both knees 01/19/2020   Tobacco abuse counseling 01/19/2020   Sepsis secondary to UTI ([place]) 04/24/2018   HTN (hypertension) 04/05/2018   Morbid obesity with BMI of 50.0-59.9, adult ([place]) 04/05/2018   OSA on CPAP 04/05/2018   Status post gastric bypass for obesity 04/05/2018   E. coli UTI 04/05/2018   Acute prostatitis    Bacteremia due to Gram-negative bacteria    Bacteremia 04/04/2018   Prostate cancer ([place]) 03/08/2018    Past Surgical History:  Procedure Laterality Date   GASTRIC BYPASS  2009   PROSTATE BIOPSY  2019   ?postitive       Home Medications    Prior to Admission medications   Medication Sig Start Date End Date Taking? Authorizing Provider  amoxicillin (AMOXIL) 875 MG tablet Take 1 tablet (875 mg total) by mouth 2 (two) times daily for 7 days. 04/10/21 04/17/21 Yes Sharion Balloon, NP  albuterol (PROVENTIL HFA;VENTOLIN HFA) 108 (90 Base) MCG/ACT inhaler Inhale 1-2 puffs into the lungs every 6 (six) hours as needed for wheezing or  shortness of breath.    [provider]  amLODipine (NORVASC) 10 MG tablet Take 1 tablet by mouth once daily 09/02/20   Beckie Salts, FNP  hydrochlorothiazide (HYDRODIURIL) 25 MG tablet Take 25 mg by mouth daily. 11/29/18   [provider]  losartan (COZAAR) 100 MG tablet Take 2 tablets by mouth once daily 09/02/20   Beckie Salts, FNP  tadalafil (CIALIS) 20 MG tablet Take 1 tablet by mouth once a week 02/08/21   Cletis Athens, MD    Family History Family History  Problem Relation Age of Onset   Prostate cancer Paternal Uncle    Prostate cancer Maternal Uncle    Prostate cancer Father    Prostate cancer Paternal Grandfather    Bladder Cancer Neg Hx    Kidney cancer Neg Hx     Social History Social History   Tobacco Use   Smoking status: Some Days    Types: Cigars   Smokeless tobacco: Never   Tobacco comments:    quit in 09/2015  Vaping Use   Vaping Use: Never used  Substance Use Topics   Alcohol use: Yes    Alcohol/week: 2.0 standard drinks    Types: 2 Cans of beer per week   Drug use: No     Allergies   Cashew nut oil   Review of Systems Review of Systems  Constitutional:  Negative for chills and fever.  HENT:  Positive for dental problem. Negative for ear pain, sore throat and trouble swallowing.   Respiratory:  Negative for cough and shortness of breath.   Cardiovascular:  Negative for chest pain and palpitations.  Skin:  Negative for color change and rash.  All other systems reviewed and are negative.   Physical Exam Triage Vital Signs ED Triage Vitals  Enc Vitals Group     BP      Pulse      Resp      Temp      Temp src      SpO2      Weight      Height      Head Circumference      Peak Flow      Pain Score      Pain Loc      Pain Edu?      Excl. in [place]?    No data found.  Updated Vital Signs BP (!) 165/104   Pulse 78   Temp 97.9 F (36.6 C)   Resp 20   SpO2 100%   Visual Acuity Right Eye Distance:   Left Eye  Distance:   Bilateral Distance:    Right Eye Near:   Left Eye Near:    Bilateral Near:     Physical Exam Vitals and nursing note reviewed.  Constitutional:      General: He is not in acute distress.    Appearance: He is well-developed. He is obese.  HENT:     Head: Normocephalic and atraumatic.     Mouth/Throat:     Mouth: Mucous membranes are moist.     Dentition: Abnormal dentition. Dental tenderness and dental caries present.      Comments: Teeth in poor repair.  Many dental caries and broken teeth. Eyes:     Conjunctiva/sclera: Conjunctivae normal.  Cardiovascular:     Rate and Rhythm: Normal rate and regular rhythm.     Heart sounds: Normal heart sounds.  Pulmonary:     Effort: Pulmonary effort is normal. No respiratory distress.     Breath sounds: Normal breath sounds.  Abdominal:     Palpations: Abdomen is soft.     Tenderness: There is no abdominal tenderness.  Musculoskeletal:     Cervical back: Neck supple.  Skin:    General: Skin is warm and dry.  Neurological:     General: No focal deficit present.     Mental Status: He is alert and oriented to person, place, and time.     Gait: Gait normal.  Psychiatric:        Mood and Affect: Mood normal.        Behavior: Behavior normal.     UC Treatments / Results  Labs (all labs ordered are listed, but only abnormal results are displayed) Labs Reviewed - No data to display  EKG   Radiology No results found.  Procedures Procedures (including critical care time)  Medications Ordered in UC Medications - No data to display  Initial Impression / Assessment and Plan / UC Course  I have reviewed the triage vital signs and the nursing notes.  Pertinent labs & imaging results that were available during my care of the patient were reviewed by me and considered in my medical decision making (see chart for details).  Pain due to dental caries, dental abscess.  Elevated blood pressure with hypertension.  Treating  with amoxicillin.  Dental  resource guide provided and instructed patient to follow-up with a dentist as soon as possible.  ED precautions discussed.  Also discussed with patient that his blood pressure is elevated today and needs to be rechecked by his PCP in 1 to 2 weeks.  He has not taken his blood pressure medications today.  Education provided on managing hypertension.  He agrees to plan of care.   Final Clinical Impressions(s) / UC Diagnoses   Final diagnoses:  Pain due to dental caries  Dental abscess  Elevated blood pressure reading in office with diagnosis of hypertension     Discharge Instructions      Take the antibiotic as prescribed.    A dental resource guide is attached.  Please call to make an appointment with a dentist as soon as possible.    Go to the emergency department if you have acute worsening symptoms.    Your blood pressure is elevated today at 182/105.  Please have this rechecked by your primary care provider in 1-2 weeks.          ED Prescriptions     Medication Sig Dispense Auth. Provider   amoxicillin (AMOXIL) 875 MG tablet Take 1 tablet (875 mg total) by mouth 2 (two) times daily for 7 days. 14 tablet Sharion Balloon, NP      PDMP not reviewed this encounter.   Sharion Balloon, NP 04/10/21 1244

## 2021-04-10 NOTE — Discharge Instructions (Addendum)
Take the antibiotic as prescribed.    A dental resource guide is attached.  Please call to make an appointment with a dentist as soon as possible.    Go to the emergency department if you have acute worsening symptoms.    Your blood pressure is elevated today at 182/105.  Please have this rechecked by your primary care provider in 1-2 weeks.

## 2021-04-10 NOTE — ED Triage Notes (Signed)
Pt here with broken tooth that has turned into a dental infection with facial swelling on the right side.

## 2021-04-27 ENCOUNTER — Encounter: Payer: Self-pay | Admitting: Internal Medicine

## 2021-04-27 ENCOUNTER — Other Ambulatory Visit: Payer: Self-pay

## 2021-04-27 ENCOUNTER — Ambulatory Visit: Payer: BC Managed Care – PPO | Admitting: Internal Medicine

## 2021-04-27 VITALS — BP 158/95 | HR 69 | Ht 74.0 in | Wt >= 6400 oz

## 2021-04-27 DIAGNOSIS — K029 Dental caries, unspecified: Secondary | ICD-10-CM

## 2021-04-27 DIAGNOSIS — Z6841 Body Mass Index (BMI) 40.0 and over, adult: Secondary | ICD-10-CM

## 2021-04-27 DIAGNOSIS — Z716 Tobacco abuse counseling: Secondary | ICD-10-CM

## 2021-04-27 DIAGNOSIS — I10 Essential (primary) hypertension: Secondary | ICD-10-CM

## 2021-04-27 DIAGNOSIS — Z9884 Bariatric surgery status: Secondary | ICD-10-CM | POA: Diagnosis not present

## 2021-04-27 DIAGNOSIS — C61 Malignant neoplasm of prostate: Secondary | ICD-10-CM

## 2021-04-27 MED ORDER — LEVOFLOXACIN 500 MG PO TABS: 500.0000 mg | ORAL_TABLET | Freq: Every day | ORAL | 0 refills | Status: AC

## 2021-04-27 NOTE — Assessment & Plan Note (Signed)
Prostate cancer is under control

## 2021-04-27 NOTE — Assessment & Plan Note (Signed)

## 2021-04-27 NOTE — Assessment & Plan Note (Signed)

## 2021-04-27 NOTE — Progress Notes (Signed)
Established Patient Office Visit  Subjective:  Patient ID: David Rangel, male    DOB: [birth year]  Age: 50 y.o. MRN: [id number]  CC:  Chief Complaint  Patient presents with   Follow-up    HPI  David Rangel presents for dental infection  onrt side  Past Medical History:  Diagnosis Date   Asthma    Elevated PSA    Hypertension    Prostate cancer ([place]) 2019   Sleep apnea     Past Surgical History:  Procedure Laterality Date   GASTRIC BYPASS  2009   PROSTATE BIOPSY  2019   ?postitive    Family History  Problem Relation Age of Onset   Prostate cancer Paternal Uncle    Prostate cancer Maternal Uncle    Prostate cancer Father    Prostate cancer Paternal Grandfather    Bladder Cancer Neg Hx    Kidney cancer Neg Hx     Social History   Socioeconomic History   Marital status: Divorced    Spouse name: Not on file   Number of children: Not on file   Years of education: Not on file   Highest education level: Not on file  Occupational History   Not on file  Tobacco Use   Smoking status: Some Days    Types: Cigars   Smokeless tobacco: Never   Tobacco comments:    quit in 09/2015  Vaping Use   Vaping Use: Never used  Substance and Sexual Activity   Alcohol use: Yes    Alcohol/week: 2.0 standard drinks    Types: 2 Cans of beer per week   Drug use: No   Sexual activity: Yes    Birth control/protection: None  Other Topics Concern   Not on file  Social History Narrative   Not on file   Social Determinants of Health   Financial Resource Strain: Not on file  Food Insecurity: Not on file  Transportation Needs: Not on file  Physical Activity: Not on file  Stress: Not on file  Social Connections: Not on file  Intimate Partner Violence: Not on file     Current Outpatient Medications:    levofloxacin (LEVAQUIN) 500 MG tablet, Take 1 tablet (500 mg total) by mouth daily for 7 days., Disp: 7 tablet, Rfl: 0   albuterol (PROVENTIL HFA;VENTOLIN HFA) 108 (90  Base) MCG/ACT inhaler, Inhale 1-2 puffs into the lungs every 6 (six) hours as needed for wheezing or shortness of breath., Disp: , Rfl:    amLODipine (NORVASC) 10 MG tablet, Take 1 tablet by mouth once daily, Disp: 90 tablet, Rfl: 0   hydrochlorothiazide (HYDRODIURIL) 25 MG tablet, Take 25 mg by mouth daily., Disp: , Rfl:    losartan (COZAAR) 100 MG tablet, Take 2 tablets by mouth once daily, Disp: 180 tablet, Rfl: 0   tadalafil (CIALIS) 20 MG tablet, Take 1 tablet by mouth once a week, Disp: 10 tablet, Rfl: 0   Allergies  Allergen Reactions   Cashew Nut Oil Anaphylaxis    ROS Review of Systems  Constitutional: Negative.   HENT: Negative.    Eyes: Negative.   Respiratory: Negative.    Cardiovascular: Negative.   Gastrointestinal: Negative.   Endocrine: Negative.   Genitourinary: Negative.   Musculoskeletal: Negative.   Skin: Negative.   Allergic/Immunologic: Negative.   Neurological: Negative.   Hematological: Negative.   Psychiatric/Behavioral: Negative.    All other systems reviewed and are negative.    Objective:    Physical Exam Vitals reviewed.  Constitutional:      Appearance: Normal appearance.  HENT:     Mouth/Throat:     Mouth: Mucous membranes are moist.  Eyes:     Pupils: Pupils are equal, round, and reactive to light.  Neck:     Vascular: No carotid bruit.  Cardiovascular:     Rate and Rhythm: Normal rate and regular rhythm.     Pulses: Normal pulses.     Heart sounds: Normal heart sounds.  Pulmonary:     Effort: Pulmonary effort is normal.     Breath sounds: Normal breath sounds.  Abdominal:     General: Bowel sounds are normal.     Palpations: Abdomen is soft. There is no hepatomegaly, splenomegaly or mass.     Tenderness: There is no abdominal tenderness.     Hernia: No hernia is present.  Musculoskeletal:     Cervical back: Neck supple.     Right lower leg: No edema.     Left lower leg: No edema.  Skin:    Findings: No rash.  Neurological:      Mental Status: He is alert and oriented to person, place, and time.     Motor: No weakness.  Psychiatric:        Mood and Affect: Mood normal.        Behavior: Behavior normal.    BP (!) 158/95   Pulse 69   Ht 6\' 2"  (1.88 m)   Wt (!) 445 lb 1.6 oz (201.9 kg)   BMI 57.15 kg/m  Wt Readings from Last 3 Encounters:  04/27/21 (!) 445 lb 1.6 oz (201.9 kg)  03/09/21 (!) 420 lb (190.5 kg)  09/07/20 (!) 427 lb (193.7 kg)     Health Maintenance Due  Topic Date Due   COVID-19 Vaccine (1) Never done   Hepatitis C Screening  Never done   TETANUS/TDAP  Never done   Zoster Vaccines- Shingrix (1 of 2) Never done   COLONOSCOPY (Pts 45-32yrs Insurance coverage will need to be confirmed)  Never done   INFLUENZA VACCINE  Never done    There are no preventive care reminders to display for this patient.  Lab Results  Component Value Date   TSH 1.14 01/19/2020   Lab Results  Component Value Date   WBC 5.3 01/19/2020   HGB 14.3 01/19/2020   HCT 42.6 01/19/2020   MCV 83.2 01/19/2020   PLT 189 01/19/2020   Lab Results  Component Value Date   NA 141 01/19/2020   K 4.9 01/19/2020   CO2 20 01/19/2020   GLUCOSE 79 01/19/2020   BUN 11 01/19/2020   CREATININE 1.19 01/19/2020   BILITOT 0.4 01/19/2020   ALKPHOS 56 04/24/2018   AST 19 01/19/2020   ALT 18 01/19/2020   PROT 7.4 01/19/2020   ALBUMIN 3.6 04/24/2018   CALCIUM 9.5 01/19/2020   ANIONGAP 10 04/25/2018   No results found for: CHOL No results found for: HDL No results found for: LDLCALC No results found for: TRIG No results found for: CHOLHDL No results found for: HGBA1C    Assessment & Plan:   Problem List Items Addressed This Visit       Cardiovascular and Mediastinum   Essential hypertension     Patient denies any chest pain or shortness of breath there is no history of palpitation or paroxysmal nocturnal dyspnea   patient was advised to follow low-salt low-cholesterol diet    ideally I want to keep systolic  blood pressure below 130  mmHg, patient was asked to check blood pressure one times a week and give me a report on that.  Patient will be follow-up in 3 months  or earlier as needed, patient will call me back for any change in the cardiovascular symptoms Patient was advised to buy a book from local bookstore concerning blood pressure and read several chapters  every day.  This will be supplemented by some of the material we will give him from the office.  Patient should also utilize other resources like YouTube and Internet to learn more about the blood pressure and the diet.        Digestive   [place] - Primary    Patient has swelling of the right jaw with infection I have started the patient on Levaquin.  Patient has not tolerated amoxicillin      Relevant Medications   levofloxacin (LEVAQUIN) 500 MG tablet     Genitourinary   Prostate cancer ([facility])    Prostate cancer is under control      Relevant Medications   levofloxacin (LEVAQUIN) 500 MG tablet     Other   Morbid obesity with BMI of 50.0-59.9, adult ([place])    - I encouraged the patient to lose weight.  - I educated them on making healthy dietary choices including eating more fruits and vegetables and less fried foods. - I encouraged the patient to exercise more, and educated on the benefits of exercise including weight loss, diabetes prevention, and hypertension prevention.   Dietary counseling with a registered dietician  Referral to a weight management support group (e.g. Weight Watchers, Overeaters Anonymous)  If your BMI is greater than 29 or you have gained more than 15 pounds you should work on weight loss.  Attend a healthy cooking class       Status post gastric bypass for obesity   Tobacco abuse counseling    Meds ordered this encounter  Medications   levofloxacin (LEVAQUIN) 500 MG tablet    Sig: Take 1 tablet (500 mg total) by mouth daily for 7 days.    Dispense:  7 tablet    Refill:  0    Follow-up: No  follow-ups on file.    Cletis Athens, MD

## 2021-04-27 NOTE — Assessment & Plan Note (Signed)
Patient has swelling of the right jaw with infection I have started the patient on Levaquin.  Patient has not tolerated amoxicillin

## 2021-05-17 ENCOUNTER — Other Ambulatory Visit: Payer: Self-pay | Admitting: Internal Medicine

## 2021-05-23 ENCOUNTER — Other Ambulatory Visit: Payer: Self-pay | Admitting: Internal Medicine

## 2021-07-11 ENCOUNTER — Other Ambulatory Visit: Payer: Self-pay

## 2021-07-11 MED ORDER — AMLODIPINE BESYLATE 10 MG PO TABS: 10.0000 mg | ORAL_TABLET | Freq: Every day | ORAL | 3 refills | Status: DC

## 2021-07-15 DIAGNOSIS — G4733 Obstructive sleep apnea (adult) (pediatric): Secondary | ICD-10-CM | POA: Diagnosis not present

## 2021-08-09 ENCOUNTER — Other Ambulatory Visit: Payer: Self-pay

## 2021-08-09 ENCOUNTER — Encounter: Payer: Self-pay | Admitting: Internal Medicine

## 2021-08-09 ENCOUNTER — Ambulatory Visit (INDEPENDENT_AMBULATORY_CARE_PROVIDER_SITE_OTHER): Payer: BC Managed Care – PPO | Admitting: Internal Medicine

## 2021-08-09 VITALS — BP 173/102 | HR 82 | Ht 74.0 in | Wt >= 6400 oz

## 2021-08-09 DIAGNOSIS — M1711 Unilateral primary osteoarthritis, right knee: Secondary | ICD-10-CM | POA: Diagnosis not present

## 2021-08-09 DIAGNOSIS — Z716 Tobacco abuse counseling: Secondary | ICD-10-CM

## 2021-08-09 DIAGNOSIS — G4733 Obstructive sleep apnea (adult) (pediatric): Secondary | ICD-10-CM

## 2021-08-09 DIAGNOSIS — I10 Essential (primary) hypertension: Secondary | ICD-10-CM

## 2021-08-09 DIAGNOSIS — C61 Malignant neoplasm of prostate: Secondary | ICD-10-CM

## 2021-08-09 DIAGNOSIS — K602 Anal fissure, unspecified: Secondary | ICD-10-CM | POA: Diagnosis not present

## 2021-08-09 DIAGNOSIS — Z6841 Body Mass Index (BMI) 40.0 and over, adult: Secondary | ICD-10-CM

## 2021-08-09 DIAGNOSIS — Z9989 Dependence on other enabling machines and devices: Secondary | ICD-10-CM

## 2021-08-09 MED ORDER — DOCUSATE SODIUM 100 MG PO CAPS: 100.0000 mg | ORAL_CAPSULE | Freq: Two times a day (BID) | ORAL | 0 refills | Status: DC

## 2021-08-09 NOTE — Assessment & Plan Note (Signed)
Refer to [employer]

## 2021-08-09 NOTE — Progress Notes (Signed)
Established Patient Office Visit  Subjective:  Patient ID: David Rangel, male    DOB: 09-May-1971  Age: 51 y.o. MRN: [id number]  CC:  Chief Complaint  Patient presents with   skin abcess    Patient complains of possible skin abscess near his rectum. Patient states it started 1 week ago. Painful when touching.    HPI  David Rangel presents for anal pain  Past Medical History:  Diagnosis Date   Asthma    Elevated PSA    Hypertension    Prostate cancer ([place]) 2019   Sleep apnea     Past Surgical History:  Procedure Laterality Date   GASTRIC BYPASS  2009   PROSTATE BIOPSY  2019   ?postitive    Family History  Problem Relation Age of Onset   Prostate cancer Paternal Uncle    Prostate cancer Maternal Uncle    Prostate cancer Father    Prostate cancer Paternal Grandfather    Bladder Cancer Neg Hx    Kidney cancer Neg Hx     Social History   Socioeconomic History   Marital status: Divorced    Spouse name: Not on file   Number of children: Not on file   Years of education: Not on file   Highest education level: Not on file  Occupational History   Not on file  Tobacco Use   Smoking status: Some Days    Types: Cigars   Smokeless tobacco: Never   Tobacco comments:    quit in 09/2015  Vaping Use   Vaping Use: Never used  Substance and Sexual Activity   Alcohol use: Yes    Alcohol/week: 2.0 standard drinks    Types: 2 Cans of beer per week   Drug use: No   Sexual activity: Yes    Birth control/protection: None  Other Topics Concern   Not on file  Social History Narrative   Not on file   Social Determinants of Health   Financial Resource Strain: Not on file  Food Insecurity: Not on file  Transportation Needs: Not on file  Physical Activity: Not on file  Stress: Not on file  Social Connections: Not on file  Intimate Partner Violence: Not on file     Current Outpatient Medications:    albuterol (PROVENTIL HFA;VENTOLIN HFA) 108 (90 Base) MCG/ACT  inhaler, Inhale 1-2 puffs into the lungs every 6 (six) hours as needed for wheezing or shortness of breath., Disp: , Rfl:    amLODipine (NORVASC) 10 MG tablet, Take 1 tablet (10 mg total) by mouth daily., Disp: 90 tablet, Rfl: 3   docusate sodium (COLACE) 100 MG capsule, Take 1 capsule (100 mg total) by mouth 2 (two) times daily., Disp: 20 capsule, Rfl: 0   hydrochlorothiazide (HYDRODIURIL) 25 MG tablet, Take 25 mg by mouth daily., Disp: , Rfl:    losartan (COZAAR) 100 MG tablet, Take 2 tablets by mouth once daily, Disp: 180 tablet, Rfl: 0   tadalafil (CIALIS) 20 MG tablet, Take 1 tablet by mouth once a week, Disp: 10 tablet, Rfl: 0   Allergies  Allergen Reactions   Cashew Nut Oil Anaphylaxis    ROS Review of Systems  Constitutional: Negative.   HENT: Negative.    Eyes: Negative.   Respiratory: Negative.    Cardiovascular: Negative.   Gastrointestinal: Negative.   Endocrine: Negative.   Genitourinary: Negative.   Musculoskeletal: Negative.   Skin: Negative.   Allergic/Immunologic: Negative.   Neurological: Negative.   Hematological: Negative.   Psychiatric/Behavioral:  Negative.    All other systems reviewed and are negative.    Objective:    Physical Exam Vitals reviewed.  Constitutional:      Appearance: Normal appearance.  HENT:     Mouth/Throat:     Mouth: Mucous membranes are moist.  Eyes:     Pupils: Pupils are equal, round, and reactive to light.  Neck:     Vascular: No carotid bruit.  Cardiovascular:     Rate and Rhythm: Normal rate and regular rhythm.     Pulses: Normal pulses.     Heart sounds: Normal heart sounds.  Pulmonary:     Effort: Pulmonary effort is normal.     Breath sounds: Normal breath sounds.  Abdominal:     General: Bowel sounds are normal.     Palpations: Abdomen is soft. There is no hepatomegaly, splenomegaly or mass.     Tenderness: There is no abdominal tenderness.     Hernia: No hernia is present.  Genitourinary:    Comments:  Rectal fissure noted on exam Musculoskeletal:     Cervical back: Neck supple.     Right lower leg: No edema.     Left lower leg: No edema.  Skin:    Findings: No rash.  Neurological:     Mental Status: He is alert and oriented to person, place, and time.     Motor: No weakness.  Psychiatric:        Mood and Affect: Mood normal.        Behavior: Behavior normal.    BP (!) 173/102    Pulse 82    Ht 6\' 2"  (1.88 m)    Wt (!) 446 lb 3.2 oz (202.4 kg)    BMI 57.29 kg/m  Wt Readings from Last 3 Encounters:  08/09/21 (!) 446 lb 3.2 oz (202.4 kg)  04/27/21 (!) 445 lb 1.6 oz (201.9 kg)  03/09/21 (!) 420 lb (190.5 kg)     Health Maintenance Due  Topic Date Due   Hepatitis C Screening  Never done   TETANUS/TDAP  Never done   Zoster Vaccines- Shingrix (1 of 2) Never done   COLONOSCOPY (Pts 45-53yrs Insurance coverage will need to be confirmed)  Never done   COVID-19 Vaccine (3 - Pfizer risk series) 04/26/2020    There are no preventive care reminders to display for this patient.  Lab Results  Component Value Date   TSH 1.14 01/19/2020   Lab Results  Component Value Date   WBC 5.3 01/19/2020   HGB 14.3 01/19/2020   HCT 42.6 01/19/2020   MCV 83.2 01/19/2020   PLT 189 01/19/2020   Lab Results  Component Value Date   NA 141 01/19/2020   K 4.9 01/19/2020   CO2 20 01/19/2020   GLUCOSE 79 01/19/2020   BUN 11 01/19/2020   CREATININE 1.19 01/19/2020   BILITOT 0.4 01/19/2020   ALKPHOS 56 04/24/2018   AST 19 01/19/2020   ALT 18 01/19/2020   PROT 7.4 01/19/2020   ALBUMIN 3.6 04/24/2018   CALCIUM 9.5 01/19/2020   ANIONGAP 10 04/25/2018   No results found for: CHOL No results found for: HDL No results found for: LDLCALC No results found for: TRIG No results found for: CHOLHDL No results found for: HGBA1C    Assessment & Plan:   Problem List Items Addressed This Visit       Cardiovascular and Mediastinum   Essential hypertension     Patient denies any chest pain or  shortness  of breath there is no history of palpitation or paroxysmal nocturnal dyspnea   patient was advised to follow low-salt low-cholesterol diet    ideally I want to keep systolic blood pressure below 130 mmHg, patient was asked to check blood pressure one times a week and give me a report on that.  Patient will be follow-up in 3 months  or earlier as needed, patient will call me back for any change in the cardiovascular symptoms Patient was advised to buy a book from local bookstore concerning blood pressure and read several chapters  every day.  This will be supplemented by some of the material we will give him from the office.  Patient should also utilize other resources like YouTube and Internet to learn more about the blood pressure and the diet.        Respiratory   OSA on CPAP    Patient uses CPAP        Digestive   Fissure in ano - Primary    Patient was given Anusol suppository to use twice a day        Musculoskeletal and Integument   Arthritis of right knee    Refer to [facility]        Genitourinary   Prostate cancer [facility])     Other   Morbid obesity with BMI of 50.0-59.9, adult ([place])    - I encouraged the patient to lose weight.  - I educated them on making healthy dietary choices including eating more fruits and vegetables and less fried foods. - I encouraged the patient to exercise more, and educated on the benefits of exercise including weight loss, diabetes prevention, and hypertension prevention.   Dietary counseling with a registered dietician  Referral to a weight management support group (e.g. Weight Watchers, Overeaters Anonymous)  If your BMI is greater than 29 or you have gained more than 15 pounds you should work on weight loss.  Attend a healthy cooking class       Tobacco abuse counseling    - I instructed the patient to stop smoking and provided them with smoking cessation materials.  - I informed the patient that smoking puts them at increased  risk for cancer, COPD, hypertension, and more.  - Informed the patient to seek help if they begin to have trouble breathing, develop chest pain, start to cough up blood, feel faint, or pass out.       Meds ordered this encounter  Medications   docusate sodium (COLACE) 100 MG capsule    Sig: Take 1 capsule (100 mg total) by mouth 2 (two) times daily.    Dispense:  20 capsule    Refill:  0    Follow-up: No follow-ups on file.    Cletis Athens, MD

## 2021-08-09 NOTE — Assessment & Plan Note (Signed)
Patient uses CPAP

## 2021-08-09 NOTE — Assessment & Plan Note (Signed)

## 2021-08-09 NOTE — Assessment & Plan Note (Signed)
-   I instructed the patient to stop smoking and provided them with smoking cessation materials.  - I informed the patient that smoking puts them at increased risk for cancer, COPD, hypertension, and more.  - Informed the patient to seek help if they begin to have trouble breathing, develop chest pain, start to cough up blood, feel faint, or pass out.  

## 2021-08-09 NOTE — Assessment & Plan Note (Signed)

## 2021-08-09 NOTE — Assessment & Plan Note (Addendum)
Patient was given Anusol suppository to use twice a day

## 2021-08-12 ENCOUNTER — Other Ambulatory Visit: Payer: Self-pay | Admitting: Internal Medicine

## 2021-08-19 ENCOUNTER — Other Ambulatory Visit: Payer: Self-pay | Admitting: Internal Medicine

## 2021-08-23 ENCOUNTER — Ambulatory Visit: Payer: BC Managed Care – PPO | Admitting: Internal Medicine

## 2021-08-23 DIAGNOSIS — K5289 Other specified noninfective gastroenteritis and colitis: Secondary | ICD-10-CM | POA: Diagnosis not present

## 2021-08-26 ENCOUNTER — Other Ambulatory Visit: Payer: Self-pay | Admitting: *Deleted

## 2021-08-26 DIAGNOSIS — Z1211 Encounter for screening for malignant neoplasm of colon: Secondary | ICD-10-CM

## 2021-08-30 ENCOUNTER — Other Ambulatory Visit: Payer: Self-pay

## 2021-08-30 DIAGNOSIS — Z1211 Encounter for screening for malignant neoplasm of colon: Secondary | ICD-10-CM

## 2021-08-30 MED ORDER — PEG 3350-KCL-NA BICARB-NACL 420 G PO SOLR: 4000.0000 mL | Freq: Once | ORAL | 0 refills | Status: AC

## 2021-08-30 NOTE — Progress Notes (Signed)
Gastroenterology Pre-Procedure Review  Request Date: 10/13/2021 Requesting Physician: Dr. Allen Norris  PATIENT REVIEW QUESTIONS: The patient responded to the following health history questions as indicated:    1. Are you having any GI issues? no 2. Do you have a personal history of Polyps? no 3. Do you have a family history of Colon Cancer or Polyps? no 4. Diabetes Mellitus? no 5. Joint replacements in the past 12 months?no 6. Major health problems in the past 3 months?no 7. Any artificial heart valves, MVP, or defibrillator?no    MEDICATIONS & ALLERGIES:    Patient reports the following regarding taking any anticoagulation/antiplatelet therapy:   Plavix, Coumadin, Eliquis, Xarelto, Lovenox, Pradaxa, Brilinta, or Effient? no Aspirin? no  Patient confirms/reports the following medications:  Current Outpatient Medications  Medication Sig Dispense Refill   albuterol (PROVENTIL HFA;VENTOLIN HFA) 108 (90 Base) MCG/ACT inhaler Inhale 1-2 puffs into the lungs every 6 (six) hours as needed for wheezing or shortness of breath.     amLODipine (NORVASC) 10 MG tablet Take 1 tablet (10 mg total) by mouth daily. 90 tablet 3   docusate sodium (COLACE) 100 MG capsule Take 1 capsule (100 mg total) by mouth 2 (two) times daily. 20 capsule 0   hydrochlorothiazide (HYDRODIURIL) 25 MG tablet Take 25 mg by mouth daily.     losartan (COZAAR) 100 MG tablet Take 2 tablets by mouth once daily 180 tablet 0   tadalafil (CIALIS) 20 MG tablet Take 1 tablet by mouth once a week 10 tablet 0   No current facility-administered medications for this visit.    Patient confirms/reports the following allergies:  Allergies  Allergen Reactions   Cashew Nut Oil Anaphylaxis    No orders of the defined types were placed in this encounter.   AUTHORIZATION INFORMATION Primary Insurance: 1D#: Group #:  Secondary Insurance: 1D#: Group #:  SCHEDULE INFORMATION: Date: 10/13/2021 Time: Location: [facility]

## 2021-09-13 ENCOUNTER — Encounter: Payer: Self-pay | Admitting: Internal Medicine

## 2021-09-13 ENCOUNTER — Ambulatory Visit: Payer: BC Managed Care – PPO | Admitting: Internal Medicine

## 2021-09-13 ENCOUNTER — Other Ambulatory Visit: Payer: Self-pay

## 2021-09-13 VITALS — BP 166/90 | HR 79 | Ht 74.0 in | Wt >= 6400 oz

## 2021-09-13 DIAGNOSIS — K602 Anal fissure, unspecified: Secondary | ICD-10-CM | POA: Diagnosis not present

## 2021-09-13 DIAGNOSIS — C61 Malignant neoplasm of prostate: Secondary | ICD-10-CM

## 2021-09-13 DIAGNOSIS — G4733 Obstructive sleep apnea (adult) (pediatric): Secondary | ICD-10-CM

## 2021-09-13 DIAGNOSIS — I10 Essential (primary) hypertension: Secondary | ICD-10-CM

## 2021-09-13 DIAGNOSIS — Z716 Tobacco abuse counseling: Secondary | ICD-10-CM

## 2021-09-13 DIAGNOSIS — Z9989 Dependence on other enabling machines and devices: Secondary | ICD-10-CM

## 2021-09-13 DIAGNOSIS — Z6841 Body Mass Index (BMI) 40.0 and over, adult: Secondary | ICD-10-CM

## 2021-09-13 NOTE — Progress Notes (Signed)
Established Patient Office Visit  Subjective:  Patient ID: David Rangel, male    DOB: [birth year]  Age: 51 y.o. MRN: [id number]  CC:  Chief Complaint  Patient presents with   Anal Fissure   Hypertension    Patient present for recheck of BP    Hypertension   David Rangel presents for  Check up Past Medical History:  Diagnosis Date   Asthma    Elevated PSA    Hypertension    Prostate cancer ([place]) 2019   Sleep apnea     Past Surgical History:  Procedure Laterality Date   GASTRIC BYPASS  2009   PROSTATE BIOPSY  2019   ?postitive    Family History  Problem Relation Age of Onset   Prostate cancer Paternal Uncle    Prostate cancer Maternal Uncle    Prostate cancer Father    Prostate cancer Paternal Grandfather    Bladder Cancer Neg Hx    Kidney cancer Neg Hx     Social History   Socioeconomic History   Marital status: Divorced    Spouse name: Not on file   Number of children: Not on file   Years of education: Not on file   Highest education level: Not on file  Occupational History   Not on file  Tobacco Use   Smoking status: Some Days    Types: Cigars   Smokeless tobacco: Never   Tobacco comments:    quit in 09/2015  Vaping Use   Vaping Use: Never used  Substance and Sexual Activity   Alcohol use: Yes    Alcohol/week: 2.0 standard drinks    Types: 2 Cans of beer per week   Drug use: No   Sexual activity: Yes    Birth control/protection: None  Other Topics Concern   Not on file  Social History Narrative   Not on file   Social Determinants of Health   Financial Resource Strain: Not on file  Food Insecurity: Not on file  Transportation Needs: Not on file  Physical Activity: Not on file  Stress: Not on file  Social Connections: Not on file  Intimate Partner Violence: Not on file     Current Outpatient Medications:    albuterol (PROVENTIL HFA;VENTOLIN HFA) 108 (90 Base) MCG/ACT inhaler, Inhale 1-2 puffs into the lungs every 6 (six) hours  as needed for wheezing or shortness of breath., Disp: , Rfl:    amLODipine (NORVASC) 10 MG tablet, Take 1 tablet (10 mg total) by mouth daily., Disp: 90 tablet, Rfl: 3   docusate sodium (COLACE) 100 MG capsule, Take 1 capsule (100 mg total) by mouth 2 (two) times daily., Disp: 20 capsule, Rfl: 0   hydrochlorothiazide (HYDRODIURIL) 25 MG tablet, Take 25 mg by mouth daily., Disp: , Rfl:    losartan (COZAAR) 100 MG tablet, Take 2 tablets by mouth once daily, Disp: 180 tablet, Rfl: 0   tadalafil (CIALIS) 20 MG tablet, Take 1 tablet by mouth once a week, Disp: 10 tablet, Rfl: 0   Allergies  Allergen Reactions   Cashew Nut Oil Anaphylaxis    ROS Review of Systems  Constitutional: Negative.   HENT: Negative.    Eyes: Negative.   Respiratory: Negative.    Cardiovascular: Negative.   Gastrointestinal: Negative.   Endocrine: Negative.   Genitourinary: Negative.   Musculoskeletal: Negative.   Skin: Negative.   Allergic/Immunologic: Negative.   Neurological: Negative.   Hematological: Negative.   Psychiatric/Behavioral: Negative.    All other systems reviewed and  are negative.    Objective:    Physical Exam Vitals reviewed.  Constitutional:      Appearance: Normal appearance.  HENT:     Mouth/Throat:     Mouth: Mucous membranes are moist.  Eyes:     Pupils: Pupils are equal, round, and reactive to light.  Neck:     Vascular: No carotid bruit.  Cardiovascular:     Rate and Rhythm: Normal rate and regular rhythm.     Pulses: Normal pulses.     Heart sounds: Normal heart sounds.  Pulmonary:     Effort: Pulmonary effort is normal.     Breath sounds: Normal breath sounds.  Abdominal:     General: Bowel sounds are normal.     Palpations: Abdomen is soft. There is no hepatomegaly, splenomegaly or mass.     Tenderness: There is no abdominal tenderness.     Hernia: No hernia is present.  Musculoskeletal:     Cervical back: Neck supple.     Right lower leg: No edema.     Left  lower leg: No edema.  Skin:    Findings: No rash.  Neurological:     Mental Status: He is alert and oriented to person, place, and time.     Motor: No weakness.  Psychiatric:        Mood and Affect: Mood normal.        Behavior: Behavior normal.    BP (!) 166/90    Pulse 79    Ht 6\' 2"  (1.88 m)    Wt (!) 446 lb 8 oz (202.5 kg)    BMI 57.33 kg/m  Wt Readings from Last 3 Encounters:  09/13/21 (!) 446 lb 8 oz (202.5 kg)  08/09/21 (!) 446 lb 3.2 oz (202.4 kg)  04/27/21 (!) 445 lb 1.6 oz (201.9 kg)     Health Maintenance Due  Topic Date Due   Hepatitis C Screening  Never done   TETANUS/TDAP  Never done   Zoster Vaccines- Shingrix (1 of 2) Never done   COLONOSCOPY (Pts 45-45yrs Insurance coverage will need to be confirmed)  Never done   COVID-19 Vaccine (3 - Pfizer risk series) 04/26/2020    There are no preventive care reminders to display for this patient.  Lab Results  Component Value Date   TSH 1.14 01/19/2020   Lab Results  Component Value Date   WBC 5.3 01/19/2020   HGB 14.3 01/19/2020   HCT 42.6 01/19/2020   MCV 83.2 01/19/2020   PLT 189 01/19/2020   Lab Results  Component Value Date   NA 141 01/19/2020   K 4.9 01/19/2020   CO2 20 01/19/2020   GLUCOSE 79 01/19/2020   BUN 11 01/19/2020   CREATININE 1.19 01/19/2020   BILITOT 0.4 01/19/2020   ALKPHOS 56 04/24/2018   AST 19 01/19/2020   ALT 18 01/19/2020   PROT 7.4 01/19/2020   ALBUMIN 3.6 04/24/2018   CALCIUM 9.5 01/19/2020   ANIONGAP 10 04/25/2018   No results found for: CHOL No results found for: HDL No results found for: LDLCALC No results found for: TRIG No results found for: CHOLHDL No results found for: HGBA1C    Assessment & Plan:   Problem List Items Addressed This Visit       Cardiovascular and Mediastinum   Essential hypertension - Primary     Respiratory   OSA on CPAP     Digestive   Fissure in ano    Refer to the surgeon  Genitourinary   Prostate cancer [facility])     Being followed up by urologist        Other   Morbid obesity with BMI of 50.0-59.9, adult ([place])    - I encouraged the patient to lose weight.  - I educated them on making healthy dietary choices including eating more fruits and vegetables and less fried foods. - I encouraged the patient to exercise more, and educated on the benefits of exercise including weight loss, diabetes prevention, and hypertension prevention.   Dietary counseling with a registered dietician  Referral to a weight management support group (e.g. Weight Watchers, Overeaters Anonymous)  If your BMI is greater than 29 or you have gained more than 15 pounds you should work on weight loss.  Attend a healthy cooking class       Tobacco abuse counseling    - I instructed the patient to stop smoking and provided them with smoking cessation materials.  - I informed the patient that smoking puts them at increased risk for cancer, COPD, hypertension, and more.  - Informed the patient to seek help if they begin to have trouble breathing, develop chest pain, start to cough up blood, feel faint, or pass out.       No orders of the defined types were placed in this encounter.   Follow-up: No follow-ups on file.    Cletis Athens, MD

## 2021-09-13 NOTE — Assessment & Plan Note (Signed)
Being followed up by urologist 

## 2021-09-13 NOTE — Assessment & Plan Note (Signed)
Refer to the surgeon 

## 2021-09-13 NOTE — Assessment & Plan Note (Signed)

## 2021-09-13 NOTE — Assessment & Plan Note (Signed)
-   I instructed the patient to stop smoking and provided them with smoking cessation materials.  - I informed the patient that smoking puts them at increased risk for cancer, COPD, hypertension, and more.  - Informed the patient to seek help if they begin to have trouble breathing, develop chest pain, start to cough up blood, feel faint, or pass out.  

## 2021-10-11 DIAGNOSIS — J0110 Acute frontal sinusitis, unspecified: Secondary | ICD-10-CM | POA: Diagnosis not present

## 2021-10-11 DIAGNOSIS — I10 Essential (primary) hypertension: Secondary | ICD-10-CM | POA: Diagnosis not present

## 2021-10-11 DIAGNOSIS — Z20822 Contact with and (suspected) exposure to covid-19: Secondary | ICD-10-CM | POA: Diagnosis not present

## 2021-10-11 DIAGNOSIS — Z03818 Encounter for observation for suspected exposure to other biological agents ruled out: Secondary | ICD-10-CM | POA: Diagnosis not present

## 2021-10-12 ENCOUNTER — Encounter: Payer: Self-pay | Admitting: Gastroenterology

## 2021-10-13 ENCOUNTER — Encounter: Payer: Self-pay | Admitting: Gastroenterology

## 2021-10-13 ENCOUNTER — Ambulatory Visit: Payer: BC Managed Care – PPO | Admitting: Anesthesiology

## 2021-10-13 ENCOUNTER — Encounter
Admission: RE | Disposition: A | Discharge status: Home / Self Care | Payer: Self-pay | Source: Home / Self Care | Attending: Gastroenterology

## 2021-10-13 ENCOUNTER — Ambulatory Visit
Admission: RE | Admit: 2021-10-13 | Discharge: 2021-10-13 | Disposition: A | Discharge status: Home / Self Care | Payer: BC Managed Care – PPO | Attending: Gastroenterology | Admitting: Gastroenterology

## 2021-10-13 DIAGNOSIS — J45909 Unspecified asthma, uncomplicated: Secondary | ICD-10-CM | POA: Diagnosis not present

## 2021-10-13 DIAGNOSIS — K640 First degree hemorrhoids: Secondary | ICD-10-CM | POA: Insufficient documentation

## 2021-10-13 DIAGNOSIS — Z1211 Encounter for screening for malignant neoplasm of colon: Secondary | ICD-10-CM | POA: Diagnosis not present

## 2021-10-13 DIAGNOSIS — G4730 Sleep apnea, unspecified: Secondary | ICD-10-CM | POA: Insufficient documentation

## 2021-10-13 DIAGNOSIS — I10 Essential (primary) hypertension: Secondary | ICD-10-CM | POA: Insufficient documentation

## 2021-10-13 DIAGNOSIS — Z6841 Body Mass Index (BMI) 40.0 and over, adult: Secondary | ICD-10-CM | POA: Diagnosis not present

## 2021-10-13 DIAGNOSIS — G4733 Obstructive sleep apnea (adult) (pediatric): Secondary | ICD-10-CM | POA: Diagnosis not present

## 2021-10-13 DIAGNOSIS — F17290 Nicotine dependence, other tobacco product, uncomplicated: Secondary | ICD-10-CM | POA: Insufficient documentation

## 2021-10-13 DIAGNOSIS — Z79899 Other long term (current) drug therapy: Secondary | ICD-10-CM | POA: Diagnosis not present

## 2021-10-13 DIAGNOSIS — Z9884 Bariatric surgery status: Secondary | ICD-10-CM | POA: Diagnosis not present

## 2021-10-13 DIAGNOSIS — Z8546 Personal history of malignant neoplasm of prostate: Secondary | ICD-10-CM | POA: Insufficient documentation

## 2021-10-13 DIAGNOSIS — F1010 Alcohol abuse, uncomplicated: Secondary | ICD-10-CM | POA: Diagnosis not present

## 2021-10-13 HISTORY — PX: COLONOSCOPY WITH PROPOFOL: SHX5780

## 2021-10-13 SURGERY — COLONOSCOPY WITH PROPOFOL
Anesthesia: General

## 2021-10-13 MED ORDER — PROPOFOL 500 MG/50ML IV EMUL
INTRAVENOUS | Status: DC | PRN
  Administered 2021-10-13: 80 mg via INTRAVENOUS

## 2021-10-13 MED ORDER — PROPOFOL 10 MG/ML IV BOLUS
INTRAVENOUS | Status: DC | PRN
  Administered 2021-10-13: 150 ug/kg/min via INTRAVENOUS

## 2021-10-13 MED ORDER — SIMETHICONE 40 MG/0.6ML PO SUSP
ORAL | Status: DC | PRN
  Administered 2021-10-13: 120 mL

## 2021-10-13 MED ORDER — SODIUM CHLORIDE 0.9 % IV SOLN: INTRAVENOUS | Status: DC

## 2021-10-13 MED ORDER — LIDOCAINE HCL (CARDIAC) PF 100 MG/5ML IV SOSY
PREFILLED_SYRINGE | INTRAVENOUS | Status: DC | PRN
  Administered 2021-10-13: 50 mg via INTRAVENOUS

## 2021-10-13 NOTE — Transfer of Care (Signed)
Immediate Anesthesia Transfer of Care Note ? ?Patient: David Rangel ? ?Procedure(s) Performed: COLONOSCOPY WITH PROPOFOL ? ?Patient Location: PACU ? ?Anesthesia Type:General ? ?Level of Consciousness: awake and alert  ? ?Airway & Oxygen Therapy: Patient Spontanous Breathing ? ?Post-op Assessment: Report given to RN and Post -op Vital signs reviewed and stable ? ?Post vital signs: Reviewed and stable ? ?Last Vitals:  ?Vitals Value Taken Time  ?BP 122/72 10/13/21 1130  ?Temp 36.2 ?C 10/13/21 1127  ?Pulse 72 10/13/21 1130  ?Resp 23 10/13/21 1130  ?SpO2 100 % 10/13/21 1130  ? ? ?Last Pain:  ?Vitals:  ? 10/13/21 1127  ?TempSrc: Temporal  ?PainSc: 0-No pain  ?   ? ?  ? ?Complications: No notable events documented. ?

## 2021-10-13 NOTE — Op Note (Signed)
[facility] ?Gastroenterology ?Patient Name: David Rangel ?Procedure Date: 10/13/2021 10:55 AM ?MRN: [id number] ?Account #: [id number] ?Date of Birth: [birth year] ?Admit Type: Outpatient ?Age: 51 ?Room: [facility] ENDO ROOM 3 ?Gender: Male ?Note Status: Finalized ?Instrument Name: Colonoscope [id number] ?Procedure:             Colonoscopy ?Indications:           Screening for colorectal malignant neoplasm ?Providers:             Lucilla Lame MD, MD ?Referring MD:          Cletis Athens, MD (Referring MD) ?Medicines:             Propofol per Anesthesia ?Complications:         No immediate complications. ?Procedure:             Pre-Anesthesia Assessment: ?                       - Prior to the procedure, a History and Physical was  ?                       performed, and patient medications and allergies were  ?                       reviewed. The patient's tolerance of previous  ?                       anesthesia was also reviewed. The risks and benefits  ?                       of the procedure and the sedation options and risks  ?                       were discussed with the patient. All questions were  ?                       answered, and informed consent was obtained. Prior  ?                       Anticoagulants: The patient has taken no previous  ?                       anticoagulant or antiplatelet agents. ASA Grade  ?                       Assessment: II - A patient with mild systemic disease.  ?                       After reviewing the risks and benefits, the patient  ?                       was deemed in satisfactory condition to undergo the  ?                       procedure. ?                       After obtaining informed consent, the colonoscope was  ?  passed under direct vision. Throughout the procedure,  ?                       the patient's blood pressure, pulse, and oxygen  ?                       saturations were monitored continuously. The  ?                        Colonoscope was introduced through the anus and  ?                       advanced to the the cecum, identified by appendiceal  ?                       orifice and ileocecal valve. The colonoscopy was  ?                       performed without difficulty. The patient tolerated  ?                       the procedure well. The quality of the bowel  ?                       preparation was excellent. ?Findings: ?     The perianal and digital rectal examinations were normal. ?     Non-bleeding internal hemorrhoids were found during retroflexion. The  ?     hemorrhoids were Grade I (internal hemorrhoids that do not prolapse). ?Impression:            - Non-bleeding internal hemorrhoids. ?                       - No specimens collected. ?Recommendation:        - Discharge patient to home. ?                       - Resume previous diet. ?                       - Continue present medications. ?                       - Repeat colonoscopy in 10 years for screening  ?                       purposes. ?Procedure Code(s):     --- Professional --- ?                       [id number], Colonoscopy, flexible; diagnostic, including  ?                       collection of specimen(s) by brushing or washing, when  ?                       performed (separate procedure) ?Diagnosis Code(s):     --- Professional --- ?                       Z12.11, Encounter for screening for malignant neoplasm  ?  of colon ?CPT copyright 2019 American Medical Association. All rights reserved. ?The codes documented in this report are preliminary and upon coder review may  ?be revised to meet current compliance requirements. ?Lucilla Lame MD, MD ?10/13/2021 11:24:50 AM ?This report has been signed electronically. ?Number of Addenda: 0 ?Note Initiated On: 10/13/2021 10:55 AM ?Scope Withdrawal Time: 0 hours 8 minutes 29 seconds  ?Total Procedure Duration: 0 hours 10 minutes 26 seconds  ?Estimated Blood Loss:  Estimated blood loss: none. ?     [facility] ?

## 2021-10-13 NOTE — H&P (Signed)
? ?David Lame, MD [facility] ?[address] ?[place], [place] [address] ?Phone: [phone] ?Fax : [phone] ? ?Primary Care Physician:  Cletis Athens, MD ?Primary Gastroenterologist:  Dr. Allen Norris ? ?Pre-Procedure History & Physical: ?HPI:  David Rangel is a 51 y.o. male is here for a screening colonoscopy.  ? ?Past Medical History:  ?Diagnosis Date  ? Asthma   ? Elevated PSA   ? Hypertension   ? Prostate cancer ([place]) 2019  ? Sleep apnea   ? ? ?Past Surgical History:  ?Procedure Laterality Date  ? GASTRIC BYPASS  2009  ? PROSTATE BIOPSY  2019  ? ?postitive  ? ? ?Prior to Admission medications   ?Medication Sig Start Date End Date Taking? Authorizing Provider  ?albuterol (PROVENTIL HFA;VENTOLIN HFA) 108 (90 Base) MCG/ACT inhaler Inhale 1-2 puffs into the lungs every 6 (six) hours as needed for wheezing or shortness of breath.   Yes [provider]  ?amLODipine (NORVASC) 10 MG tablet Take 1 tablet (10 mg total) by mouth daily. 07/11/21  Yes Masoud, Viann Shove, MD  ?hydrochlorothiazide (HYDRODIURIL) 25 MG tablet Take 25 mg by mouth daily. 11/29/18  Yes [provider]  ?losartan (COZAAR) 100 MG tablet Take 2 tablets by mouth once daily 08/12/21  Yes Masoud, Viann Shove, MD  ?docusate sodium (COLACE) 100 MG capsule Take 1 capsule (100 mg total) by mouth 2 (two) times daily. ?Patient not taking: Reported on 10/13/2021 08/09/21   Cletis Athens, MD  ?tadalafil (CIALIS) 20 MG tablet Take 1 tablet by mouth once a week 08/22/21   Cletis Athens, MD  ? ? ?Allergies as of 08/31/2021 - Review Complete 08/30/2021  ?Allergen Reaction Noted  ? 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 ? ?Family History  ?Problem Relation Age of Onset  ? Prostate cancer Paternal Uncle   ? Prostate cancer Maternal Uncle   ? Prostate cancer Father   ? Prostate cancer Paternal Grandfather   ? Bladder Cancer Neg Hx   ? Kidney cancer Neg Hx   ? ? ?Social History  ? ?Socioeconomic History  ? Marital status: Divorced  ?  Spouse name: Not on file  ?  Number of children: Not on file  ? Years of education: Not on file  ? Highest education level: Not on file  ?Occupational History  ? Not on file  ?Tobacco Use  ? Smoking status: Some Days  ?  Types: Cigars  ? Smokeless tobacco: Never  ? Tobacco comments:  ?  quit in 09/2015  ?Vaping Use  ? Vaping Use: Never used  ?Substance and Sexual Activity  ? Alcohol use: Yes  ?  Alcohol/week: 2.0 standard drinks  ?  Types: 2 Cans of beer per week  ? Drug use: No  ? Sexual activity: Yes  ?  Birth control/protection: None  ?Other Topics Concern  ? Not on file  ?Social History Narrative  ? Not on file  ? ?Social Determinants of Health  ? ?Financial Resource Strain: Not on file  ?Food Insecurity: Not on file  ?Transportation Needs: Not on file  ?Physical Activity: Not on file  ?Stress: Not on file  ?Social Connections: Not on file  ?Intimate Partner Violence: Not on file  ? ? ?Review of Systems: ?See HPI, otherwise negative ROS ? ?Physical Exam: ?BP 138/69   Pulse 74   Temp (!) 96.7 ?F (35.9 ?C) (Temporal)   Resp 20   Ht '6\' 2"'$  (1.88 m)   Wt (!) 199.6 kg   SpO2 98%   BMI  56.49 kg/m?  ?General:   Alert,  pleasant and cooperative in NAD ?Head:  Normocephalic and atraumatic. ?Neck:  Supple; no masses or thyromegaly. ?Lungs:  Clear throughout to auscultation.    ?Heart:  Regular rate and rhythm. ?Abdomen:  Soft, nontender and nondistended. Normal bowel sounds, without guarding, and without rebound.   ?Neurologic:  Alert and  oriented x4;  grossly normal neurologically. ? ?Impression/Plan: ?David Rangel is now here to undergo a screening colonoscopy. ? ?Risks, benefits, and alternatives regarding colonoscopy have been reviewed with the patient.  Questions have been answered.  All parties agreeable. ?

## 2021-10-13 NOTE — Anesthesia Procedure Notes (Signed)
Date/Time: 10/13/2021 10:55 AM ?Performed by: Johnna Acosta, CRNA ?Pre-anesthesia Checklist: Patient identified, Emergency Drugs available, Suction available, Patient being monitored and Timeout performed ?Patient Re-evaluated:Patient Re-evaluated prior to induction ?Oxygen Delivery Method: Supernova nasal CPAP ?Preoxygenation: Pre-oxygenation with 100% oxygen ?Induction Type: IV induction ? ? ? ? ?

## 2021-10-13 NOTE — Anesthesia Preprocedure Evaluation (Addendum)
Anesthesia Evaluation  ?Patient identified by MRN, date of birth, ID band ?Patient awake ? ? ? ?Reviewed: ?Allergy & Precautions, H&P , NPO status , Patient's Chart, lab work & pertinent test results, reviewed documented beta blocker date and time  ? ?Airway ?Mallampati: III ? ? ?Neck ROM: full ? ? ? Dental ? ?(+) Poor Dentition ?  ?Pulmonary ?neg shortness of breath, asthma , sleep apnea and Continuous Positive Airway Pressure Ventilation , Current SmokerPatient did not abstain from smoking.,  ?  ?Pulmonary exam normal ? ? ? ? ? ? ? Cardiovascular ?Exercise Tolerance: Good ?hypertension, On Medications ?negative cardio ROS ?Normal cardiovascular exam ?Rhythm:regular Rate:Normal ? ? ?  ?Neuro/Psych ?negative neurological ROS ? negative psych ROS  ? GI/Hepatic ?negative GI ROS, Neg liver ROS,   ?Endo/Other  ?Morbid obesity ? Renal/GU ?negative Renal ROS  ?negative genitourinary ?  ?Musculoskeletal ? ? Abdominal ?  ?Peds ? Hematology ?negative hematology ROS ?(+)   ?Anesthesia Other Findings ?Past Medical History: ?No date: Asthma ?No date: Elevated PSA ?No date: Hypertension ?2019: Prostate cancer ([place]) ?No date: Sleep apnea ?Past Surgical History: ?2009: GASTRIC BYPASS ?2019: PROSTATE BIOPSY ?    Comment:  ?postitive ?BMI   ? Body Mass Index: 56.49 kg/m?  ?  ? Reproductive/Obstetrics ?negative OB ROS ? ?  ? ? ? ? ? ? ? ? ? ? ? ? ? ?  ?  ? ? ? ? ? ? ? ? ?Anesthesia Physical ?Anesthesia Plan ? ?ASA: 3 ? ?Anesthesia Plan: General  ? ?Post-op Pain Management:   ? ?Induction:  ? ?PONV Risk Score and Plan:  ? ?Airway Management Planned:  ? ?Additional Equipment:  ? ?Intra-op Plan:  ? ?Post-operative Plan:  ? ?Informed Consent: I have reviewed the patients History and Physical, chart, labs and discussed the procedure including the risks, benefits and alternatives for the proposed anesthesia with the patient or authorized representative who has indicated his/her understanding and  acceptance.  ? ? ? ?Dental Advisory Given ? ?Plan Discussed with: CRNA ? ?Anesthesia Plan Comments:   ? ? ? ? ? ? ?Anesthesia Quick Evaluation ? ?

## 2021-10-13 NOTE — Anesthesia Postprocedure Evaluation (Signed)
Anesthesia Post Note ? ?Patient: David Rangel ? ?Procedure(s) Performed: COLONOSCOPY WITH PROPOFOL ? ?Patient location during evaluation: PACU ?Anesthesia Type: General ?Level of consciousness: awake and alert ?Pain management: pain level controlled ?Vital Signs Assessment: post-procedure vital signs reviewed and stable ?Respiratory status: spontaneous breathing, nonlabored ventilation, respiratory function stable and patient connected to nasal cannula oxygen ?Cardiovascular status: blood pressure returned to baseline and stable ?Postop Assessment: no apparent nausea or vomiting ?Anesthetic complications: no ? ? ?No notable events documented. ? ? ?Last Vitals:  ?Vitals:  ? 10/13/21 1127 10/13/21 1130  ?BP: 122/72 122/72  ?Pulse: 74 72  ?Resp: 19 (!) 23  ?Temp: (!) 36.2 ?C   ?SpO2: 100% 100%  ?  ?Last Pain:  ?Vitals:  ? 10/13/21 1127  ?TempSrc: Temporal  ?PainSc: 0-No pain  ? ? ?  ?  ?  ?  ?  ?  ? ?Molli Barrows ? ? ? ? ?

## 2021-10-14 ENCOUNTER — Encounter: Payer: Self-pay | Admitting: Gastroenterology

## 2021-10-17 ENCOUNTER — Other Ambulatory Visit: Payer: Self-pay

## 2021-10-17 ENCOUNTER — Ambulatory Visit: Payer: BC Managed Care – PPO | Admitting: Internal Medicine

## 2021-10-17 ENCOUNTER — Encounter: Payer: Self-pay | Admitting: Internal Medicine

## 2021-10-17 VITALS — BP 143/93 | HR 115 | Ht 74.0 in | Wt >= 6400 oz

## 2021-10-17 DIAGNOSIS — Z6841 Body Mass Index (BMI) 40.0 and over, adult: Secondary | ICD-10-CM

## 2021-10-17 DIAGNOSIS — I10 Essential (primary) hypertension: Secondary | ICD-10-CM

## 2021-10-17 DIAGNOSIS — M25561 Pain in right knee: Secondary | ICD-10-CM | POA: Diagnosis not present

## 2021-10-17 DIAGNOSIS — K602 Anal fissure, unspecified: Secondary | ICD-10-CM

## 2021-10-17 DIAGNOSIS — M1711 Unilateral primary osteoarthritis, right knee: Secondary | ICD-10-CM

## 2021-10-17 DIAGNOSIS — Z716 Tobacco abuse counseling: Secondary | ICD-10-CM

## 2021-10-17 DIAGNOSIS — G4733 Obstructive sleep apnea (adult) (pediatric): Secondary | ICD-10-CM | POA: Diagnosis not present

## 2021-10-17 DIAGNOSIS — Z9989 Dependence on other enabling machines and devices: Secondary | ICD-10-CM

## 2021-10-17 DIAGNOSIS — I890 Lymphedema, not elsewhere classified: Secondary | ICD-10-CM

## 2021-10-17 DIAGNOSIS — Z1211 Encounter for screening for malignant neoplasm of colon: Secondary | ICD-10-CM

## 2021-10-17 NOTE — Assessment & Plan Note (Signed)

## 2021-10-17 NOTE — Progress Notes (Signed)
? ?Established Patient Office Visit ? ?Subjective:  ?Patient ID: David Rangel, male    DOB: 1970/10/20  Age: 51 y.o. MRN: [id number] ? ?CC:  ?Chief Complaint  ?Patient presents with  ? Follow-up  ? Knee Pain  ?  Patient having right knee pain and would like referral to ortho   ? ? ?Knee Pain  ? ? ?David Rangel presents for swelling  of rt leg  and arthiritis  of both  knees ? ?Past Medical History:  ?Diagnosis Date  ? Asthma   ? Elevated PSA   ? Hypertension   ? Prostate cancer ([place]) 2019  ? Sleep apnea   ? ? ?Past Surgical History:  ?Procedure Laterality Date  ? COLONOSCOPY WITH PROPOFOL N/A 10/13/2021  ? Procedure: COLONOSCOPY WITH PROPOFOL;  Surgeon: Lucilla Lame, MD;  Location: [facility] ENDOSCOPY;  Service: Endoscopy;  Laterality: N/A;  ? GASTRIC BYPASS  2009  ? PROSTATE BIOPSY  2019  ? ?postitive  ? ? ?Family History  ?Problem Relation Age of Onset  ? Prostate cancer Paternal Uncle   ? Prostate cancer Maternal Uncle   ? Prostate cancer Father   ? Prostate cancer Paternal Grandfather   ? Bladder Cancer Neg Hx   ? Kidney cancer Neg Hx   ? ? ?Social History  ? ?Socioeconomic History  ? Marital status: Divorced  ?  Spouse name: Not on file  ? Number of children: Not on file  ? Years of education: Not on file  ? Highest education level: Not on file  ?Occupational History  ? Not on file  ?Tobacco Use  ? Smoking status: Some Days  ?  Types: Cigars  ? Smokeless tobacco: Never  ? Tobacco comments:  ?  quit in 09/2015  ?Vaping Use  ? Vaping Use: Never used  ?Substance and Sexual Activity  ? Alcohol use: Yes  ?  Alcohol/week: 2.0 standard drinks  ?  Types: 2 Cans of beer per week  ? Drug use: No  ? Sexual activity: Yes  ?  Birth control/protection: None  ?Other Topics Concern  ? Not on file  ?Social History Narrative  ? Not on file  ? ?Social Determinants of Health  ? ?Financial Resource Strain: Not on file  ?Food Insecurity: Not on file  ?Transportation Needs: Not on file  ?Physical Activity: Not on file  ?Stress: Not on  file  ?Social Connections: Not on file  ?Intimate Partner Violence: Not on file  ? ? ? ?Current Outpatient Medications:  ?  albuterol (PROVENTIL HFA;VENTOLIN HFA) 108 (90 Base) MCG/ACT inhaler, Inhale 1-2 puffs into the lungs every 6 (six) hours as needed for wheezing or shortness of breath., Disp: , Rfl:  ?  amLODipine (NORVASC) 10 MG tablet, Take 1 tablet (10 mg total) by mouth daily., Disp: 90 tablet, Rfl: 3 ?  docusate sodium (COLACE) 100 MG capsule, Take 1 capsule (100 mg total) by mouth 2 (two) times daily. (Patient not taking: Reported on 10/13/2021), Disp: 20 capsule, Rfl: 0 ?  hydrochlorothiazide (HYDRODIURIL) 25 MG tablet, Take 25 mg by mouth daily., Disp: , Rfl:  ?  losartan (COZAAR) 100 MG tablet, Take 2 tablets by mouth once daily, Disp: 180 tablet, Rfl: 0 ?  tadalafil (CIALIS) 20 MG tablet, Take 1 tablet by mouth once a week, Disp: 10 tablet, Rfl: 0  ? ?Allergies  ?Allergen Reactions  ? Cashew Nut Oil Anaphylaxis  ? ? ?ROS ?Review of Systems  ?Constitutional: Negative.   ?HENT: Negative.    ?Eyes: Negative.   ?  Respiratory: Negative.    ?Cardiovascular: Negative.   ?Gastrointestinal: Negative.   ?Endocrine: Negative.   ?Genitourinary: Negative.   ?Musculoskeletal:  Positive for gait problem, joint swelling and myalgias.  ?     Swelling  of rt leg  ?Skin: Negative.   ?Allergic/Immunologic: Negative.   ?Hematological: Negative.   ?Psychiatric/Behavioral: Negative.    ?All other systems reviewed and are negative. ? ?  ?Objective:  ?  ?Physical Exam ?Vitals reviewed.  ?Constitutional:   ?   Appearance: Normal appearance.  ?HENT:  ?   Mouth/Throat:  ?   Mouth: Mucous membranes are moist.  ?Eyes:  ?   Pupils: Pupils are equal, round, and reactive to light.  ?Neck:  ?   Vascular: No carotid bruit.  ?Cardiovascular:  ?   Rate and Rhythm: Normal rate and regular rhythm.  ?   Pulses: Normal pulses.  ?   Heart sounds: Normal heart sounds.  ?Pulmonary:  ?   Effort: Pulmonary effort is normal.  ?   Breath sounds:  Normal breath sounds.  ?Abdominal:  ?   General: Bowel sounds are normal.  ?   Palpations: Abdomen is soft. There is no hepatomegaly, splenomegaly or mass.  ?   Tenderness: There is no abdominal tenderness.  ?   Hernia: No hernia is present.  ?Musculoskeletal:  ?   Cervical back: Neck supple.  ?   Right lower leg: No edema.  ?   Left lower leg: No edema.  ?     Legs: ? ?Skin: ?   Findings: No rash.  ?Neurological:  ?   Mental Status: He is alert and oriented to person, place, and time.  ?   Motor: No weakness.  ?Psychiatric:     ?   Mood and Affect: Mood normal.     ?   Behavior: Behavior normal.  ? ? ?BP (!) 143/93   Pulse (!) 115   Ht '6\' 2"'$  (1.88 m)   Wt (!) 441 lb 3.2 oz (200.1 kg)   BMI 56.65 kg/m?  ?Wt Readings from Last 3 Encounters:  ?10/17/21 (!) 441 lb 3.2 oz (200.1 kg)  ?10/13/21 (!) 440 lb (199.6 kg)  ?09/13/21 (!) 446 lb 8 oz (202.5 kg)  ? ? ? ?Health Maintenance Due  ?Topic Date Due  ? Hepatitis C Screening  Never done  ? TETANUS/TDAP  Never done  ? Zoster Vaccines- Shingrix (1 of 2) Never done  ? COVID-19 Vaccine (3 - Pfizer risk series) 04/26/2020  ? ? ?There are no preventive care reminders to display for this patient. ? ?Lab Results  ?Component Value Date  ? TSH 1.14 01/19/2020  ? ?Lab Results  ?Component Value Date  ? WBC 5.3 01/19/2020  ? HGB 14.3 01/19/2020  ? HCT 42.6 01/19/2020  ? MCV 83.2 01/19/2020  ? PLT 189 01/19/2020  ? ?Lab Results  ?Component Value Date  ? NA 141 01/19/2020  ? K 4.9 01/19/2020  ? CO2 20 01/19/2020  ? GLUCOSE 79 01/19/2020  ? BUN 11 01/19/2020  ? CREATININE 1.19 01/19/2020  ? BILITOT 0.4 01/19/2020  ? ALKPHOS 56 04/24/2018  ? AST 19 01/19/2020  ? ALT 18 01/19/2020  ? PROT 7.4 01/19/2020  ? ALBUMIN 3.6 04/24/2018  ? CALCIUM 9.5 01/19/2020  ? ANIONGAP 10 04/25/2018  ? ?No results found for: CHOL ?No results found for: HDL ?No results found for: [place] ?No results found for: TRIG ?No results found for: CHOLHDL ?No results found for: HGBA1C ? ?  ?Assessment &  Plan:   ? ?Problem List Items Addressed This Visit   ? ?  ? Cardiovascular and Mediastinum  ? Essential hypertension  ?   Patient denies any chest pain or shortness of breath there is no history of palpitation or paroxysmal nocturnal dyspnea ?  patient was advised to follow low-salt low-cholesterol diet ? ?  ideally I want to keep systolic blood pressure below 130 mmHg, patient was asked to check blood pressure one times a week and give me a report on that.  Patient will be follow-up in 3 months  or earlier as needed, patient will call me back for any change in the cardiovascular symptoms ?Patient was advised to buy a book from local bookstore concerning blood pressure and read several chapters  every day.  This will be supplemented by some of the material we will give him from the office.  Patient should also utilize other resources like YouTube and Internet to learn more about the blood pressure and the diet. ?  ?  ?  ? Respiratory  ? OSA on CPAP  ?  Patient uses CPAP machine ?  ?  ?  ? Digestive  ? Fissure in ano  ?  Patient has a colonoscopy fissure in ano is stabilized ?  ?  ?  ? Musculoskeletal and Integument  ? Arthritis of right knee  ?  Refer to orthopedics ?  ?  ?  ? Other  ? Morbid obesity with BMI of 50.0-59.9, adult ([place])  ?  - I encouraged the patient to lose weight.  ?- I educated them on making healthy dietary choices including eating more fruits and vegetables and less fried foods. ?- I encouraged the patient to exercise more, and educated on the benefits of exercise including weight loss, diabetes prevention, and hypertension prevention.  ? Dietary counseling with a registered dietician ? Referral to a weight management support group (e.g. Weight Watchers, Overeaters Anonymous) ?? If your BMI is greater than 29 or you have gained more than 15 pounds you should work on weight loss. ?? Attend a healthy cooking class  ?  ?  ? Tobacco abuse counseling  ?  - I instructed the patient to stop smoking and  provided them with smoking cessation materials.  ?- I informed the patient that smoking puts them at increased risk for cancer, COPD, hypertension, and more.  ?- Informed the patient to seek help if they begin to have tro

## 2021-10-17 NOTE — Assessment & Plan Note (Signed)

## 2021-10-17 NOTE — Assessment & Plan Note (Signed)
Patient uses CPAP machine ?

## 2021-10-17 NOTE — Assessment & Plan Note (Signed)
Patient has a colonoscopy fissure in ano is stabilized ?

## 2021-10-17 NOTE — Assessment & Plan Note (Signed)
Refer to orthopedics 

## 2021-10-17 NOTE — Assessment & Plan Note (Signed)
-   I instructed the patient to stop smoking and provided them with smoking cessation materials.  - I informed the patient that smoking puts them at increased risk for cancer, COPD, hypertension, and more.  - Informed the patient to seek help if they begin to have trouble breathing, develop chest pain, start to cough up blood, feel faint, or pass out.  

## 2021-10-17 NOTE — Assessment & Plan Note (Signed)
Colonoscopy was normal ?

## 2021-10-18 NOTE — Addendum Note (Signed)
Addended by: Alois Cliche on: 10/18/2021 08:59 AM ? ? Modules accepted: Orders ? ?

## 2021-10-20 DIAGNOSIS — J069 Acute upper respiratory infection, unspecified: Secondary | ICD-10-CM | POA: Diagnosis not present

## 2021-10-24 DIAGNOSIS — G4733 Obstructive sleep apnea (adult) (pediatric): Secondary | ICD-10-CM | POA: Diagnosis not present

## 2021-11-04 ENCOUNTER — Ambulatory Visit
Admission: RE | Admit: 2021-11-04 | Discharge: 2021-11-04 | Disposition: A | Discharge status: Other Acute Inpatient Hospital | Payer: BC Managed Care – PPO | Source: Ambulatory Visit | Attending: Emergency Medicine | Admitting: Emergency Medicine

## 2021-11-04 VITALS — BP 173/91 | HR 88 | Temp 98.6°F | Resp 20

## 2021-11-04 DIAGNOSIS — R0602 Shortness of breath: Secondary | ICD-10-CM | POA: Diagnosis not present

## 2021-11-04 DIAGNOSIS — R1012 Left upper quadrant pain: Secondary | ICD-10-CM | POA: Diagnosis not present

## 2021-11-04 DIAGNOSIS — I10 Essential (primary) hypertension: Secondary | ICD-10-CM | POA: Diagnosis not present

## 2021-11-04 LAB — POCT URINALYSIS DIP (MANUAL ENTRY)
Bilirubin, UA: NEGATIVE
Blood, UA: NEGATIVE
Glucose, UA: NEGATIVE mg/dL
Ketones, POC UA: NEGATIVE mg/dL
Leukocytes, UA: NEGATIVE
Nitrite, UA: NEGATIVE
Protein Ur, POC: NEGATIVE mg/dL
Spec Grav, UA: 1.015 (ref 1.010–1.025)
Urobilinogen, UA: 1 E.U./dL
pH, UA: 5.5 (ref 5.0–8.0)

## 2021-11-04 NOTE — ED Triage Notes (Addendum)
Pt here with left side/flank pain x 1 week as well as increased urinary frequency after finishing Augmentin for a sinus infection.  ?

## 2021-11-04 NOTE — ED Provider Notes (Signed)
?[facility] ? ? ? ?CSN: [id number] ?Arrival date & time: 11/04/21  1726 ? ? ?  ? ?History   ?Chief Complaint ?Chief Complaint  ?Patient presents with  ? Flank Pain  ? ? ?HPI ?David Rangel is a 51 y.o. male.  Patient presents with 3 to 4-day history of left upper abdominal pain.  The abdominal pain is constant; no aggravating or alleviating factors; currently 5/10.  He also reports fatigue and shortness of breath with exertion x 2 weeks.  He recently traveled back from [place] by car; he states the car was small and he had little space to move around.  He denies fever, chills, chest pain, cough, nausea, vomiting, diarrhea, constipation, dysuria, hematuria, or other symptoms.  Last bowel movement today.  [facility]; diagnosed with acute sinusitis; treated with doxycycline.  He also states he was recently treated with Augmentin for a sinus infection; this was prescribed at another urgent care.  His medical history includes hypertension, sepsis due to UTI, prostate cancer, asthma, sleep apnea, morbid obesity, gastric bypass 2009. ? ?The history is provided by the patient and medical records.  ? ?Past Medical History:  ?Diagnosis Date  ? Asthma   ? Elevated PSA   ? Hypertension   ? Prostate cancer ([place]) 2019  ? Sleep apnea   ? ? ?Patient Active Problem List  ? Diagnosis Date Noted  ? Colon cancer screening   ? Fissure in ano 08/09/2021  ? Caries 04/27/2021  ? Arthritis of right knee 03/02/2020  ? Prepatellar bursitis of both knees 01/19/2020  ? Tobacco abuse counseling 01/19/2020  ? Sepsis secondary to UTI ([place]) 04/24/2018  ? Essential hypertension 04/05/2018  ? Morbid obesity with BMI of 50.0-59.9, adult ([place]) 04/05/2018  ? OSA on CPAP 04/05/2018  ? Status post gastric bypass for obesity 04/05/2018  ? E. coli UTI 04/05/2018  ? Acute prostatitis   ? Bacteremia due to Gram-negative bacteria   ? Bacteremia 04/04/2018  ? Prostate cancer ([place]) 03/08/2018  ? ? ?Past Surgical  History:  ?Procedure Laterality Date  ? COLONOSCOPY WITH PROPOFOL N/A 10/13/2021  ? Procedure: COLONOSCOPY WITH PROPOFOL;  Surgeon: Lucilla Lame, MD;  Location[facility] ENDOSCOPY;  Service: Endoscopy;  Laterality: N/A;  ? GASTRIC BYPASS  2009  ? PROSTATE BIOPSY  2019  ? ?postitive  ? ? ? ? ? ?Home Medications   ? ?Prior to Admission medications   ?Medication Sig Start Date End Date Taking? Authorizing Provider  ?albuterol (PROVENTIL HFA;VENTOLIN HFA) 108 (90 Base) MCG/ACT inhaler Inhale 1-2 puffs into the lungs every 6 (six) hours as needed for wheezing or shortness of breath.    [provider]  ?amLODipine (NORVASC) 10 MG tablet Take 1 tablet (10 mg total) by mouth daily. 07/11/21   Cletis [place], MD  ?docusate sodium (COLACE) 100 MG capsule Take 1 capsule (100 mg total) by mouth 2 (two) times daily. ?Patient not taking: Reported on 10/13/2021 08/09/21   Cletis [place], MD  ?hydrochlorothiazide (HYDRODIURIL) 25 MG tablet Take 25 mg by mouth daily. 11/29/18   [provider]  ?losartan (COZAAR) 100 MG tablet Take 2 tablets by mouth once daily 08/12/21   Cletis [place], MD  ?tadalafil (CIALIS) 20 MG tablet Take 1 tablet by mouth once a week 08/22/21   Cletis [place], MD  ? ? ?Family History ?Family History  ?Problem Relation Age of Onset  ? Prostate cancer Paternal Uncle   ? Prostate cancer Maternal Uncle   ?  Prostate cancer Father   ? Prostate cancer Paternal Grandfather   ? Bladder Cancer Neg Hx   ? Kidney cancer Neg Hx   ? ? ?Social History ?Social History  ? ?Tobacco Use  ? Smoking status: Some Days  ?  Types: Cigars  ? Smokeless tobacco: Never  ? Tobacco comments:  ?  quit in 09/2015  ?Vaping Use  ? Vaping Use: Never used  ?Substance Use Topics  ? Alcohol use: Yes  ?  Alcohol/week: 2.0 standard drinks  ?  Types: 2 Cans of beer per week  ? Drug use: No  ? ? ? ?Allergies   ?Cashew nut oil ? ? ?Review of Systems ?Review of Systems  ?Constitutional:  Positive for fatigue. Negative for chills and fever.   ?Respiratory:  Positive for shortness of breath. Negative for cough.   ?Cardiovascular:  Negative for chest pain and palpitations.  ?Gastrointestinal:  Positive for abdominal pain. Negative for blood in stool, constipation, diarrhea, nausea and vomiting.  ?Genitourinary:  Negative for dysuria and hematuria.  ?All other systems reviewed and are negative. ? ? ?Physical Exam ?Triage Vital Signs ?ED Triage Vitals  ?Enc Vitals Group  ?   BP   ?   Pulse   ?   Resp   ?   Temp   ?   Temp src   ?   SpO2   ?   Weight   ?   Height   ?   Head Circumference   ?   Peak Flow   ?   Pain Score   ?   Pain Loc   ?   Pain Edu?   ?   Excl. in [place]?   ? ?No data found. ? ?Updated Vital Signs ?BP (!) 173/91   Pulse 88   Temp 98.6 ?F (37 ?C)   Resp 20   SpO2 95%  ? ?Visual Acuity ?Right Eye Distance:   ?Left Eye Distance:   ?Bilateral Distance:   ? ?Right Eye Near:   ?Left Eye Near:    ?Bilateral Near:    ? ?Physical Exam ?Vitals and nursing note reviewed.  ?Constitutional:   ?   General: He is not in acute distress. ?   Appearance: He is well-developed. He is obese. He is not ill-appearing.  ?   Comments: Morbidly obese.  ?HENT:  ?   Head: Normocephalic and atraumatic.  ?   Mouth/Throat:  ?   Mouth: Mucous membranes are moist.  ?Cardiovascular:  ?   Rate and Rhythm: Normal rate and regular rhythm.  ?   Heart sounds: Normal heart sounds.  ?Pulmonary:  ?   Effort: Pulmonary effort is normal. No respiratory distress.  ?   Breath sounds: Normal breath sounds.  ?Abdominal:  ?   General: Bowel sounds are normal.  ?   Palpations: Abdomen is soft.  ?   Tenderness: There is no abdominal tenderness. There is no right CVA tenderness, left CVA tenderness, guarding or rebound.  ?Musculoskeletal:  ?   Cervical back: Neck supple.  ?Skin: ?   General: Skin is warm and dry.  ?   Capillary Refill: Capillary refill takes less than 2 seconds.  ?Neurological:  ?   General: No focal deficit present.  ?   Mental Status: He is alert and oriented to person,  place, and time.  ?   Gait: Gait normal.  ?Psychiatric:     ?   Mood and Affect: Mood normal.     ?  Behavior: Behavior normal.  ? ? ? ?UC Treatments / Results  ?Labs ?(all labs ordered are listed, but only abnormal results are displayed) ?Labs Reviewed  ?POCT URINALYSIS DIP (MANUAL ENTRY) - Normal  ? ? ?EKG ? ? ?Radiology ?No results found. ? ?Procedures ?Procedures (including critical care time) ? ?Medications Ordered in UC ?Medications - No data to display ? ?Initial Impression / Assessment and Plan / UC Course  ?I have reviewed the triage vital signs and the nursing notes. ? ?Pertinent labs & imaging results that were available during my care of the patient were reviewed by me and considered in my medical decision making (see chart for details). ? ? LUQ abdominal pain, Shortness of breath, Morbid obesity, Elevated blood pressure with Hypertension.  Patient recently traveled to and from [place] in a small car.  Urine normal.  Based on the patient's abdominal pain and shortness of breath and his recent lengthy travel by car, I feel that he needs a higher level of care.  Discussed with patient the limitations of evaluation of his symptoms in an urgent care setting.  Sending him to the ED.  He is agreeable to this. ? ?Final Clinical Impressions(s) / UC Diagnoses  ? ?Final diagnoses:  ?Left upper quadrant abdominal pain  ?Shortness of breath  ?Morbid obesity ([place])  ?Elevated blood pressure reading in office with diagnosis of hypertension  ? ? ? ?Discharge Instructions   ? ?  ?Go to the emergency department for evaluation of your abdominal pain, shortness of breath, and elevated blood pressure. ? ?     ? ? ? ? ? ?ED Prescriptions   ?None ?  ? ?PDMP not reviewed this encounter. ?  ?Sharion Balloon, NP ?11/04/21 1811 ? ?

## 2021-11-04 NOTE — Discharge Instructions (Addendum)
Go to the emergency department for evaluation of your abdominal pain, shortness of breath, and elevated blood pressure. ? ?     ? ?

## 2021-11-13 DIAGNOSIS — G4733 Obstructive sleep apnea (adult) (pediatric): Secondary | ICD-10-CM | POA: Diagnosis not present

## 2021-11-22 ENCOUNTER — Other Ambulatory Visit: Payer: Self-pay | Admitting: Internal Medicine

## 2021-12-22 DIAGNOSIS — M25561 Pain in right knee: Secondary | ICD-10-CM | POA: Diagnosis not present

## 2021-12-22 DIAGNOSIS — M25562 Pain in left knee: Secondary | ICD-10-CM | POA: Diagnosis not present

## 2021-12-22 DIAGNOSIS — M170 Bilateral primary osteoarthritis of knee: Secondary | ICD-10-CM | POA: Insufficient documentation

## 2021-12-22 DIAGNOSIS — M13862 Other specified arthritis, left knee: Secondary | ICD-10-CM | POA: Diagnosis not present

## 2021-12-22 DIAGNOSIS — M13861 Other specified arthritis, right knee: Secondary | ICD-10-CM | POA: Diagnosis not present

## 2021-12-22 DIAGNOSIS — E669 Obesity, unspecified: Secondary | ICD-10-CM | POA: Insufficient documentation

## 2022-01-06 DIAGNOSIS — G4733 Obstructive sleep apnea (adult) (pediatric): Secondary | ICD-10-CM | POA: Diagnosis not present

## 2022-01-16 ENCOUNTER — Ambulatory Visit: Payer: BC Managed Care – PPO | Admitting: Internal Medicine

## 2022-01-16 ENCOUNTER — Ambulatory Visit (INDEPENDENT_AMBULATORY_CARE_PROVIDER_SITE_OTHER): Payer: BC Managed Care – PPO | Admitting: Internal Medicine

## 2022-01-16 ENCOUNTER — Encounter: Payer: Self-pay | Admitting: Internal Medicine

## 2022-01-16 VITALS — BP 160/96 | HR 62 | Ht 74.0 in | Wt >= 6400 oz

## 2022-01-16 DIAGNOSIS — M13861 Other specified arthritis, right knee: Secondary | ICD-10-CM | POA: Diagnosis not present

## 2022-01-16 DIAGNOSIS — Z9989 Dependence on other enabling machines and devices: Secondary | ICD-10-CM

## 2022-01-16 DIAGNOSIS — C61 Malignant neoplasm of prostate: Secondary | ICD-10-CM

## 2022-01-16 DIAGNOSIS — G4733 Obstructive sleep apnea (adult) (pediatric): Secondary | ICD-10-CM

## 2022-01-16 DIAGNOSIS — M13862 Other specified arthritis, left knee: Secondary | ICD-10-CM | POA: Diagnosis not present

## 2022-01-16 DIAGNOSIS — Z6841 Body Mass Index (BMI) 40.0 and over, adult: Secondary | ICD-10-CM

## 2022-01-16 DIAGNOSIS — Z716 Tobacco abuse counseling: Secondary | ICD-10-CM

## 2022-01-16 DIAGNOSIS — K029 Dental caries, unspecified: Secondary | ICD-10-CM

## 2022-01-16 DIAGNOSIS — I10 Essential (primary) hypertension: Secondary | ICD-10-CM

## 2022-01-16 NOTE — Assessment & Plan Note (Signed)
-   I encouraged the patient to lose weight.  - I educated them on making healthy dietary choices including eating more fruits and vegetables and less fried foods. - I encouraged the patient to exercise more, and educated on the benefits of exercise including weight loss, diabetes prevention, and hypertension prevention.   Dietary counseling with a registered dietician  Referral to a weight management support group (e.g. Weight Watchers, Overeaters Anonymous)  If your BMI is greater than 29 or you have gained more than 15 pounds you should work on weight loss.  Attend a healthy cooking class  Suggest to go back in the weight loss clinic

## 2022-01-16 NOTE — Assessment & Plan Note (Signed)
Patient is using CPAP

## 2022-01-16 NOTE — Assessment & Plan Note (Signed)
Patient has a prostate cancer Urologist is watching him carefully

## 2022-01-16 NOTE — Assessment & Plan Note (Signed)

## 2022-01-16 NOTE — Assessment & Plan Note (Signed)
Patient has been to a dentist and took care of the teeth

## 2022-01-16 NOTE — Assessment & Plan Note (Signed)
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
## 2022-02-05 DIAGNOSIS — G4733 Obstructive sleep apnea (adult) (pediatric): Secondary | ICD-10-CM | POA: Diagnosis not present

## 2022-02-15 ENCOUNTER — Other Ambulatory Visit: Payer: Self-pay | Admitting: Internal Medicine

## 2022-02-21 ENCOUNTER — Encounter (INDEPENDENT_AMBULATORY_CARE_PROVIDER_SITE_OTHER): Payer: BLUE CROSS/BLUE SHIELD | Admitting: Vascular Surgery

## 2022-03-03 ENCOUNTER — Other Ambulatory Visit: Payer: BC Managed Care – PPO

## 2022-03-03 DIAGNOSIS — C61 Malignant neoplasm of prostate: Secondary | ICD-10-CM | POA: Diagnosis not present

## 2022-03-03 DIAGNOSIS — N529 Male erectile dysfunction, unspecified: Secondary | ICD-10-CM | POA: Diagnosis not present

## 2022-03-04 LAB — FPSA% REFLEX
% FREE PSA: 8.9 %
PSA, FREE: 0.66 ng/mL

## 2022-03-04 LAB — PSA TOTAL (REFLEX TO FREE): Prostate Specific Ag, Serum: 7.4 ng/mL — ABNORMAL HIGH (ref 0.0–4.0)

## 2022-03-08 ENCOUNTER — Ambulatory Visit (INDEPENDENT_AMBULATORY_CARE_PROVIDER_SITE_OTHER): Payer: BC Managed Care – PPO | Admitting: Urology

## 2022-03-08 ENCOUNTER — Encounter: Payer: Self-pay | Admitting: Urology

## 2022-03-08 ENCOUNTER — Ambulatory Visit: Payer: BC Managed Care – PPO | Admitting: Urology

## 2022-03-08 VITALS — BP 134/78 | HR 78 | Ht 74.0 in | Wt >= 6400 oz

## 2022-03-08 DIAGNOSIS — G4733 Obstructive sleep apnea (adult) (pediatric): Secondary | ICD-10-CM | POA: Diagnosis not present

## 2022-03-08 DIAGNOSIS — Z8546 Personal history of malignant neoplasm of prostate: Secondary | ICD-10-CM | POA: Diagnosis not present

## 2022-03-08 DIAGNOSIS — N529 Male erectile dysfunction, unspecified: Secondary | ICD-10-CM | POA: Diagnosis not present

## 2022-03-08 DIAGNOSIS — C61 Malignant neoplasm of prostate: Secondary | ICD-10-CM

## 2022-03-08 MED ORDER — TADALAFIL 20 MG PO TABS
20.0000 mg | ORAL_TABLET | Freq: Every day | ORAL | 11 refills | Status: DC | PRN
Start: 2022-03-08 — End: 2023-03-13

## 2022-03-08 NOTE — Progress Notes (Signed)
   03/08/2022 3:37 PM   David Rangel [birth year] [id number]  Reason for visit: Follow up very low risk prostate cancer, ED, urinary symptoms  HPI: He is a 52 year old morbidly obese(BMI 66) African-American male with a strong family history of prostate cancer who was diagnosed with a single core of Gleason score 3+3=6 prostate cancer all the way back in 2008 by Dr. Yves Dill.  He was then lost to follow-up.  His PSA had risen to 7.5 in 2019 and he underwent re-biopsy on 03/27/2018. This showed a single core of Gleason score 3+3=6 disease with 5% max core involvement.  He is in the very low risk category.  He has a history of sepsis after prostate biopsy.   PSA bumped to 10.5 in September 2021 from 7.1 prior we have deferred DRE's with his morbid obesity and inability to palpate the prostate.  PSA 07/15/2020 remained elevated at 11.7.  He opted for a prostate MRI in January 2022 showed a 32 g prostate with no evidence of high risk prostate cancer.  There were 3 indeterminate PI-RADS 3 lesions, with the largest measuring 9 mm.  No evidence of transcapsular spread, SV involvement, neurovascular bundle involvement, or pelvic adenopathy.   We have continued to monitor the PSA since then, and has been stable.  PSA this year is down to 7.4 from 10.7 last year which is certainly reassuring.  Reassurance provided.  We discussed the low, but not 0, risk of developing metastatic prostate cancer while on active surveillance, and he would like to continue active surveillance.  He has some mild ED that is well-controlled with Cialis as needed, refill provided today.  He has mild urinary symptoms of nocturia if noncompliant with CPAP, association between sleep apnea and nocturia discussed  Cialis refilled RTC 1 year PSA prior for ongoing surveillance of low risk prostate cancer   [name], MD  [facility] [address] [place], [place] [id number] [phone]

## 2022-03-21 ENCOUNTER — Ambulatory Visit (INDEPENDENT_AMBULATORY_CARE_PROVIDER_SITE_OTHER): Payer: BC Managed Care – PPO | Admitting: Vascular Surgery

## 2022-03-21 ENCOUNTER — Encounter (INDEPENDENT_AMBULATORY_CARE_PROVIDER_SITE_OTHER): Payer: Self-pay | Admitting: Vascular Surgery

## 2022-03-21 VITALS — BP 163/94 | HR 74 | Resp 16 | Ht 74.0 in | Wt >= 6400 oz

## 2022-03-21 DIAGNOSIS — M7989 Other specified soft tissue disorders: Secondary | ICD-10-CM | POA: Diagnosis not present

## 2022-03-21 DIAGNOSIS — Z6841 Body Mass Index (BMI) 40.0 and over, adult: Secondary | ICD-10-CM

## 2022-03-21 DIAGNOSIS — I10 Essential (primary) hypertension: Secondary | ICD-10-CM

## 2022-03-21 NOTE — Progress Notes (Signed)
Patient ID: David Rangel, male   DOB: December 29, 1970, 51 y.o.   MRN: [id number]  Chief Complaint  Patient presents with   New Patient (Initial Visit)    Ref [facility] consult for lymphedema    HPI David Rangel is a 51 y.o. male.  I am asked to see the patient by Dr. Lavera Guise for evaluation of leg swelling.  He reports having torn his right ACL and meniscus several years ago, and this has yet to be repaired.  At this point, the orthopedic surgeon has told him he has to lose weight before they would consider repair.  His leg swelled with the injury, and has continued to swell progressively over time.  He has become less active and has gained more weight which has worsened the swelling.  He has a little swelling in the left leg but this is predominantly in the right leg.  He reports no history of superficial thrombophlebitis or DVT to his knowledge.  No history of wounds or infection.  The leg has become heavy and tires easily.     Past Medical History:  Diagnosis Date   Asthma    Elevated PSA    Hypertension    Prostate cancer ([place]) 2019   Sleep apnea     Past Surgical History:  Procedure Laterality Date   COLONOSCOPY WITH PROPOFOL N/A 10/13/2021   Procedure: COLONOSCOPY WITH PROPOFOL;  Surgeon: Lucilla Lame, MD;  Location: [facility] ENDOSCOPY;  Service: Endoscopy;  Laterality: N/A;   GASTRIC BYPASS  2009   PROSTATE BIOPSY  2019   ?postitive     Family History  Problem Relation Age of Onset   Hypertension Mother    Prostate cancer Father    Prostate cancer Maternal Uncle    Prostate cancer Paternal Uncle    Prostate cancer Paternal Grandfather    Bladder Cancer Neg Hx    Kidney cancer Neg Hx       Social History   Tobacco Use   Smoking status: Some Days    Types: Cigars   Smokeless tobacco: Never   Tobacco comments:    quit in 09/2015  Vaping Use   Vaping Use: Never used  Substance Use Topics   Alcohol use: Yes    Alcohol/week: 2.0 standard drinks of alcohol    Types: 2  Cans of beer per week   Drug use: No     Allergies  Allergen Reactions   Cashew Nut Oil Anaphylaxis    Current Outpatient Medications  Medication Sig Dispense Refill   albuterol (PROVENTIL HFA;VENTOLIN HFA) 108 (90 Base) MCG/ACT inhaler Inhale 1-2 puffs into the lungs every 6 (six) hours as needed for wheezing or shortness of breath.     amLODipine (NORVASC) 10 MG tablet Take 1 tablet (10 mg total) by mouth daily. 90 tablet 3   diclofenac (VOLTAREN) 75 MG EC tablet Take 75 mg by mouth 2 (two) times daily.     hydrochlorothiazide (HYDRODIURIL) 25 MG tablet Take 25 mg by mouth daily.     losartan (COZAAR) 100 MG tablet Take 2 tablets by mouth once daily 180 tablet 0   tadalafil (CIALIS) 20 MG tablet Take 1 tablet (20 mg total) by mouth daily as needed for erectile dysfunction. 30 tablet 11   No current facility-administered medications for this visit.      REVIEW OF SYSTEMS (Negative unless checked)  Constitutional: '[]'$ Weight loss  '[]'$ Fever  '[]'$ Chills Cardiac: '[]'$ Chest pain   '[]'$ Chest pressure   '[]'$ Palpitations   '[]'$ Shortness of  breath when laying flat   '[]'$ Shortness of breath at rest   '[]'$ Shortness of breath with exertion. Vascular:  '[]'$ Pain in legs with walking   '[]'$ Pain in legs at rest   '[]'$ Pain in legs when laying flat   '[]'$ Claudication   '[]'$ Pain in feet when walking  '[]'$ Pain in feet at rest  '[]'$ Pain in feet when laying flat   '[]'$ History of DVT   '[]'$ Phlebitis   '[x]'$ Swelling in legs   '[]'$ Varicose veins   '[]'$ Non-healing ulcers Pulmonary:   '[]'$ Uses home oxygen   '[]'$ Productive cough   '[]'$ Hemoptysis   '[]'$ Wheeze  '[]'$ COPD   '[]'$ Asthma Neurologic:  '[]'$ Dizziness  '[]'$ Blackouts   '[]'$ Seizures   '[]'$ History of stroke   '[]'$ History of TIA  '[]'$ Aphasia   '[]'$ Temporary blindness   '[]'$ Dysphagia   '[]'$ Weakness or numbness in arms   '[]'$ Weakness or numbness in legs Musculoskeletal:  '[x]'$ Arthritis   '[]'$ Joint swelling   '[x]'$ Joint pain   '[]'$ Low back pain Hematologic:  '[]'$ Easy bruising  '[]'$ Easy bleeding   '[]'$ Hypercoagulable state   '[]'$ Anemic   '[]'$ Hepatitis Gastrointestinal:  '[]'$ Blood in stool   '[]'$ Vomiting blood  '[]'$ Gastroesophageal reflux/heartburn   '[]'$ Abdominal pain Genitourinary:  '[]'$ Chronic kidney disease   '[]'$ Difficult urination  '[]'$ Frequent urination  '[]'$ Burning with urination   '[]'$ Hematuria Skin:  '[]'$ Rashes   '[]'$ Ulcers   '[]'$ Wounds Psychological:  '[]'$ History of anxiety   '[]'$  History of major depression.    Physical Exam BP (!) 163/94 (BP Location: Left Arm)   Pulse 74   Resp 16   Ht '6\' 2"'$  (1.88 m)   Wt (!) 449 lb 6.4 oz (203.8 kg)   BMI 57.70 kg/m  Gen:  WD/WN, NAD. Very large man. Head: /AT, No temporalis wasting.  Ear/Nose/Throat: Hearing grossly intact, nares w/o erythema or drainage, oropharynx w/o Erythema/Exudate Eyes: Conjunctiva clear, sclera non-icteric  Neck: trachea midline.  No JVD.  Pulmonary:  Good air movement, respirations not labored, no use of accessory muscles  Cardiac: RRR, no JVD Vascular:  Vessel Right Left  Radial Palpable Palpable                          PT NP NP  DP NP 1+   Gastrointestinal:. No masses, surgical incisions, or scars. Musculoskeletal: M/S 5/5 throughout.  Extremities without ischemic changes.  No deformity or atrophy. 3+ RLE, 1+ LLE edema. Neurologic: Sensation grossly intact in extremities.  Symmetrical.  Speech is fluent. Motor exam as listed above. Psychiatric: Judgment intact, Mood & affect appropriate for pt's clinical situation. Dermatologic: No rashes or ulcers noted.  No cellulitis or open wounds.    Radiology No results found.  Labs Recent Results (from the past 2160 hour(s))  PSA Total (Reflex To Free)     Status: Abnormal   Collection Time: 03/03/22  8:08 AM  Result Value Ref Range   Prostate Specific Ag, Serum 7.4 (H) 0.0 - 4.0 ng/mL    Comment: Roche ECLIA methodology. According to the American Urological Association, Serum PSA should decrease and remain at undetectable levels after radical prostatectomy. The AUA defines biochemical recurrence as an  initial PSA value 0.2 ng/mL or greater followed by a subsequent confirmatory PSA value 0.2 ng/mL or greater. Values obtained with different assay methods or kits cannot be used interchangeably. Results cannot be interpreted as absolute evidence of the presence or absence of malignant disease.    Reflex Criteria Comment     Comment: The percent free PSA is performed on a reflex basis only when the total PSA  is between 4.0 and 10.0 ng/mL.   %fPSA Reflex     Status: None   Collection Time: 03/03/22  8:08 AM  Result Value Ref Range   PSA, FREE 0.66 N/A ng/mL    Comment: Roche ECLIA methodology.   % FREE PSA 8.9 %    Comment: The table below lists the probability of prostate cancer for men with non-suspicious DRE results and total PSA between 4 and 10 ng/mL, by patient age Ricci Barker, [place], [id number]).                   % Free PSA       50-64 yr        65-75 yr                   0.00-10.00%        56%             55%                  10.01-15.00%        24%             35%                  15.01-20.00%        17%             23%                  20.01-25.00%        10%             20%                       >25.00%         5%              9% Please note:  Catalona et al did not make specific               recommendations regarding the use of               percent free PSA for any other population               of men.     Assessment/Plan:  Swelling of limb Recommend:  I have had a long discussion with the patient regarding swelling and why it  causes symptoms.  Patient will begin wearing graduated compression on a daily basis a prescription was given. The patient will  wear the stockings first thing in the morning and removing them in the evening. The patient is instructed specifically not to sleep in the stockings.   In addition, behavioral modification will be initiated.  This will include frequent elevation, use of over the counter pain medications and exercise such as  walking.  Consideration for a lymph pump will also be made based upon the effectiveness of conservative therapy.  This would help to improve the edema control and prevent sequela such as ulcers and infections   Patient should undergo duplex ultrasound of the venous system to ensure that DVT or reflux is not present.  The patient will follow-up with me after the ultrasound.    Essential hypertension blood pressure control important in reducing the progression of atherosclerotic disease. On appropriate oral medications.   Morbid obesity with BMI of 50.0-59.9, adult ([place]) We discussed that weight loss would be of critical importance to getting his  leg swelling down.      Leotis Pain 03/21/2022, 10:12 AM   This note was created with Dragon medical transcription system.  Any errors from dictation are unintentional.

## 2022-03-21 NOTE — Assessment & Plan Note (Signed)
blood pressure control important in reducing the progression of atherosclerotic disease. On appropriate oral medications.  

## 2022-03-21 NOTE — Assessment & Plan Note (Signed)

## 2022-03-21 NOTE — Patient Instructions (Signed)
Edema ? ?Edema is when you have too much fluid in your body or under your skin. Edema may make your legs, feet, and ankles swell. Swelling often happens in looser tissues, such as around your eyes. This is a common condition. It gets more common as you get older. ?There are many possible causes of edema. These include: ?Eating too much salt (sodium). ?Being on your feet or sitting for a long time. ?Certain medical conditions, such as: ?Pregnancy. ?Heart failure. ?Liver disease. ?Kidney disease. ?Cancer. ?Hot weather may make edema worse. Edema is usually painless. Your skin may look swollen or shiny. ?Follow these instructions at home: ?Medicines ?Take over-the-counter and prescription medicines only as told by your doctor. ?Your doctor may prescribe a medicine to help your body get rid of extra water (diuretic). Take this medicine if you are told to take it. ?Eating and drinking ?Eat a low-salt (low-sodium) diet as told by your doctor. Sometimes, eating less salt may reduce swelling. ?Depending on the cause of your swelling, you may need to limit how much fluid you drink (fluid restriction). ?General instructions ?Raise the injured area above the level of your heart while you are sitting or lying down. ?Do not sit still or stand for a long time. ?Do not wear tight clothes. Do not wear garters on your upper legs. ?Exercise your legs. This can help the swelling go down. ?Wear compression stockings as told by your doctor. It is important that these are the right size. These should be prescribed by your doctor to prevent possible injuries. ?If elastic bandages or wraps are recommended, use them as told by your doctor. ?Contact a doctor if: ?Treatment is not working. ?You have heart, liver, or kidney disease and have symptoms of edema. ?You have sudden and unexplained weight gain. ?Get help right away if: ?You have shortness of breath or chest pain. ?You cannot breathe when you lie down. ?You have pain, redness, or  warmth in the swollen areas. ?You have heart, liver, or kidney disease and get edema all of a sudden. ?You have a fever and your symptoms get worse all of a sudden. ?These symptoms may be an emergency. Get help right away. Call 911. ?Do not wait to see if the symptoms will go away. ?Do not drive yourself to the hospital. ?Summary ?Edema is when you have too much fluid in your body or under your skin. ?Edema may make your legs, feet, and ankles swell. Swelling often happens in looser tissues, such as around your eyes. ?Raise the injured area above the level of your heart while you are sitting or lying down. ?Follow your doctor's instructions about diet and how much fluid you can drink. ?This information is not intended to replace advice given to you by your health care provider. Make sure you discuss any questions you have with your health care provider. ?Document Revised: 03/21/2021 Document Reviewed: 03/21/2021 ?Elsevier Patient Education ? 2023 Elsevier Inc. ? ?

## 2022-03-21 NOTE — Assessment & Plan Note (Signed)
We discussed that weight loss would be of critical importance to getting his leg swelling down.

## 2022-04-11 DIAGNOSIS — G4733 Obstructive sleep apnea (adult) (pediatric): Secondary | ICD-10-CM | POA: Diagnosis not present

## 2022-04-15 DIAGNOSIS — Z6841 Body Mass Index (BMI) 40.0 and over, adult: Secondary | ICD-10-CM | POA: Diagnosis not present

## 2022-04-15 DIAGNOSIS — U071 COVID-19: Secondary | ICD-10-CM | POA: Diagnosis not present

## 2022-04-15 DIAGNOSIS — Z20822 Contact with and (suspected) exposure to covid-19: Secondary | ICD-10-CM | POA: Diagnosis not present

## 2022-04-18 ENCOUNTER — Ambulatory Visit (INDEPENDENT_AMBULATORY_CARE_PROVIDER_SITE_OTHER): Payer: BC Managed Care – PPO | Admitting: *Deleted

## 2022-04-18 ENCOUNTER — Ambulatory Visit: Payer: BC Managed Care – PPO | Admitting: Internal Medicine

## 2022-04-18 DIAGNOSIS — U071 COVID-19: Secondary | ICD-10-CM | POA: Diagnosis not present

## 2022-04-18 LAB — POC COVID19 BINAXNOW: SARS Coronavirus 2 Ag: POSITIVE — AB

## 2022-04-21 ENCOUNTER — Ambulatory Visit (INDEPENDENT_AMBULATORY_CARE_PROVIDER_SITE_OTHER): Payer: BC Managed Care – PPO | Admitting: *Deleted

## 2022-04-21 DIAGNOSIS — U071 COVID-19: Secondary | ICD-10-CM

## 2022-04-21 LAB — POC COVID19 BINAXNOW: SARS Coronavirus 2 Ag: NEGATIVE

## 2022-05-09 ENCOUNTER — Ambulatory Visit (INDEPENDENT_AMBULATORY_CARE_PROVIDER_SITE_OTHER): Payer: BC Managed Care – PPO | Admitting: Vascular Surgery

## 2022-05-09 ENCOUNTER — Ambulatory Visit (INDEPENDENT_AMBULATORY_CARE_PROVIDER_SITE_OTHER): Payer: BC Managed Care – PPO

## 2022-05-09 DIAGNOSIS — M7989 Other specified soft tissue disorders: Secondary | ICD-10-CM

## 2022-05-11 DIAGNOSIS — G4733 Obstructive sleep apnea (adult) (pediatric): Secondary | ICD-10-CM | POA: Diagnosis not present

## 2022-05-13 ENCOUNTER — Other Ambulatory Visit: Payer: Self-pay | Admitting: Internal Medicine

## 2022-05-16 ENCOUNTER — Encounter (INDEPENDENT_AMBULATORY_CARE_PROVIDER_SITE_OTHER): Payer: Self-pay | Admitting: Vascular Surgery

## 2022-05-16 ENCOUNTER — Ambulatory Visit (INDEPENDENT_AMBULATORY_CARE_PROVIDER_SITE_OTHER): Payer: BC Managed Care – PPO | Admitting: Vascular Surgery

## 2022-05-16 VITALS — BP 170/79 | HR 80 | Resp 16 | Ht 74.0 in | Wt >= 6400 oz

## 2022-05-16 DIAGNOSIS — M7989 Other specified soft tissue disorders: Secondary | ICD-10-CM | POA: Diagnosis not present

## 2022-05-16 DIAGNOSIS — I10 Essential (primary) hypertension: Secondary | ICD-10-CM

## 2022-05-16 DIAGNOSIS — Z6841 Body Mass Index (BMI) 40.0 and over, adult: Secondary | ICD-10-CM

## 2022-05-16 NOTE — Assessment & Plan Note (Signed)
Venous reflux study showed minimal left calf great saphenous vein reflux with no other evidence of reflux.  There is no DVT or superficial thrombophlebitis.  His symptoms are better.  As his mobility increases and he does more elevation and activity, I think his leg swelling will continue to improve.  He is trying to lose weight to be able to get his knee surgery which sounds like a good goal.  I will see him back as needed.

## 2022-05-16 NOTE — Progress Notes (Signed)
MRN : [id number]  David Rangel is a 51 y.o. (17-Sep-1970) male who presents with chief complaint of  Chief Complaint  Patient presents with   Follow-up    ultrasound  .  History of Present Illness: Patient returns today in follow up of his leg pain and swelling.  He says his legs are doing a fair bit better than when we initially saw him about 2 months ago.  Pain and swelling are both improved particular in the right leg.  He is does still have swelling and he still has some mobility restrictions but these are getting better.  Overall, he seems to be doing fairly well. Venous reflux study showed minimal left calf great saphenous vein reflux with no other evidence of reflux.  There is no DVT or superficial thrombophlebitis.  Current Outpatient Medications  Medication Sig Dispense Refill   albuterol (PROVENTIL HFA;VENTOLIN HFA) 108 (90 Base) MCG/ACT inhaler Inhale 1-2 puffs into the lungs every 6 (six) hours as needed for wheezing or shortness of breath.     amLODipine (NORVASC) 10 MG tablet Take 1 tablet (10 mg total) by mouth daily. 90 tablet 3   diclofenac (VOLTAREN) 75 MG EC tablet Take 75 mg by mouth 2 (two) times daily.     hydrochlorothiazide (HYDRODIURIL) 25 MG tablet Take 25 mg by mouth daily.     losartan (COZAAR) 100 MG tablet Take 2 tablets by mouth once daily 180 tablet 0   tadalafil (CIALIS) 20 MG tablet Take 1 tablet (20 mg total) by mouth daily as needed for erectile dysfunction. 30 tablet 11   No current facility-administered medications for this visit.    Past Medical History:  Diagnosis Date   Asthma    Elevated PSA    Hypertension    Prostate cancer ([place]) 2019   Sleep apnea     Past Surgical History:  Procedure Laterality Date   COLONOSCOPY WITH PROPOFOL N/A 10/13/2021   Procedure: COLONOSCOPY WITH PROPOFOL;  Surgeon: Lucilla Lame, MD;  Location: [facility] ENDOSCOPY;  Service: Endoscopy;  Laterality: N/A;   GASTRIC BYPASS  2009   PROSTATE BIOPSY  2019    ?postitive     Social History   Tobacco Use   Smoking status: Some Days    Types: Cigars   Smokeless tobacco: Never   Tobacco comments:    quit in 09/2015  Vaping Use   Vaping Use: Never used  Substance Use Topics   Alcohol use: Yes    Alcohol/week: 2.0 standard drinks of alcohol    Types: 2 Cans of beer per week   Drug use: No      Family History  Problem Relation Age of Onset   Hypertension Mother    Prostate cancer Father    Prostate cancer Maternal Uncle    Prostate cancer Paternal Uncle    Prostate cancer Paternal Grandfather    Bladder Cancer Neg Hx    Kidney cancer Neg Hx      Allergies  Allergen Reactions   Cashew Nut Oil Anaphylaxis    REVIEW OF SYSTEMS (Negative unless checked)   Constitutional: '[]'$ Weight loss  '[]'$ Fever  '[]'$ Chills Cardiac: '[]'$ Chest pain   '[]'$ Chest pressure   '[]'$ Palpitations   '[]'$ Shortness of breath when laying flat   '[]'$ Shortness of breath at rest   '[]'$ Shortness of breath with exertion. Vascular:  '[]'$ Pain in legs with walking   '[]'$ Pain in legs at rest   '[]'$ Pain in legs when laying flat   '[]'$ Claudication   '[]'$ Pain in feet  when walking  '[]'$ Pain in feet at rest  '[]'$ Pain in feet when laying flat   '[]'$ History of DVT   '[]'$ Phlebitis   '[x]'$ Swelling in legs   '[]'$ Varicose veins   '[]'$ Non-healing ulcers Pulmonary:   '[]'$ Uses home oxygen   '[]'$ Productive cough   '[]'$ Hemoptysis   '[]'$ Wheeze  '[]'$ COPD   '[]'$ Asthma Neurologic:  '[]'$ Dizziness  '[]'$ Blackouts   '[]'$ Seizures   '[]'$ History of stroke   '[]'$ History of TIA  '[]'$ Aphasia   '[]'$ Temporary blindness   '[]'$ Dysphagia   '[]'$ Weakness or numbness in arms   '[]'$ Weakness or numbness in legs Musculoskeletal:  '[x]'$ Arthritis   '[]'$ Joint swelling   '[x]'$ Joint pain   '[]'$ Low back pain Hematologic:  '[]'$ Easy bruising  '[]'$ Easy bleeding   '[]'$ Hypercoagulable state   '[]'$ Anemic  '[]'$ Hepatitis Gastrointestinal:  '[]'$ Blood in stool   '[]'$ Vomiting blood  '[]'$ Gastroesophageal reflux/heartburn   '[]'$ Abdominal pain Genitourinary:  '[]'$ Chronic kidney disease   '[]'$ Difficult urination  '[]'$ Frequent urination   '[]'$ Burning with urination   '[]'$ Hematuria Skin:  '[]'$ Rashes   '[]'$ Ulcers   '[]'$ Wounds Psychological:  '[]'$ History of anxiety   '[]'$  History of major depression.   Physical Examination  BP (!) 170/79 (BP Location: Left Arm)   Pulse 80   Resp 16   Ht '6\' 2"'$  (1.88 m)   Wt (!) 436 lb (197.8 kg)   BMI 55.98 kg/m  Gen:  WD/WN, NAD.  Obese Head: [place]/AT, No temporalis wasting. Ear/Nose/Throat: Hearing grossly intact, nares w/o erythema or drainage Eyes: Conjunctiva clear. Sclera non-icteric Neck: Supple.  Trachea midline Pulmonary:  Good air movement, no use of accessory muscles.  Cardiac: RRR, no JVD Vascular:  Vessel Right Left  Radial Palpable Palpable           Musculoskeletal: M/S 5/5 throughout.  No deformity or atrophy.  2+ right lower extremity edema, 1+ left lower extremity edema. Neurologic: Sensation grossly intact in extremities.  Symmetrical.  Speech is fluent.  Psychiatric: Judgment intact, Mood & affect appropriate for pt's clinical situation. Dermatologic: No rashes or ulcers noted.  No cellulitis or open wounds.      Labs Recent Results (from the past 2160 hour(s))  PSA Total (Reflex To Free)     Status: Abnormal   Collection Time: 03/03/22  8:08 AM  Result Value Ref Range   Prostate Specific Ag, Serum 7.4 (H) 0.0 - 4.0 ng/mL    Comment: Roche ECLIA methodology. According to the American Urological Association, Serum PSA should decrease and remain at undetectable levels after radical prostatectomy. The AUA defines biochemical recurrence as an initial PSA value 0.2 ng/mL or greater followed by a subsequent confirmatory PSA value 0.2 ng/mL or greater. Values obtained with different assay methods or kits cannot be used interchangeably. Results cannot be interpreted as absolute evidence of the presence or absence of malignant disease.    Reflex Criteria Comment     Comment: The percent free PSA is performed on a reflex basis only when the total PSA is between 4.0 and 10.0  ng/mL.   %fPSA Reflex     Status: None   Collection Time: 03/03/22  8:08 AM  Result Value Ref Range   PSA, FREE 0.66 N/A ng/mL    Comment: Roche ECLIA methodology.   % FREE PSA 8.9 %    Comment: The table below lists the probability of prostate cancer for men with non-suspicious DRE results and total PSA between 4 and 10 ng/mL, by patient age Ricci Barker, [place], [id number]).                   %  Free PSA       50-64 yr        65-75 yr                   0.00-10.00%        56%             55%                  10.01-15.00%        24%             35%                  15.01-20.00%        17%             23%                  20.01-25.00%        10%             20%                       >25.00%         5%              9% Please note:  Catalona et al did not make specific               recommendations regarding the use of               percent free PSA for any other population               of men.   POC COVID-19     Status: Abnormal   Collection Time: 04/18/22  8:50 AM  Result Value Ref Range   SARS Coronavirus 2 Ag Positive (A) Negative  POC COVID-19     Status: Normal   Collection Time: 04/21/22  9:55 AM  Result Value Ref Range   SARS Coronavirus 2 Ag Negative Negative    Radiology VAS [place] LOWER EXTREMITY VENOUS REFLUX  Result Date: 05/12/2022  Lower Venous Reflux Study Patient Name:  WIRT HEMMERICH  Date of Exam:   05/09/2022 Medical Rec #: [id number]       Accession #:    [id number] Date of Birth: 08/03/1970       Patient Gender: M Patient Age:   55 years Exam Location:  [facility] Procedure:      VAS [place] LOWER EXTREMITY VENOUS REFLUX Referring Phys: Leotis Pain --------------------------------------------------------------------------------  Indications: Swelling.  Performing Technologist: Almira Coaster RVS  Examination Guidelines: A complete evaluation includes B-mode imaging, spectral Doppler, color Doppler, and power Doppler as needed of all accessible portions of  each vessel. Bilateral testing is considered an integral part of a complete examination. Limited examinations for reoccurring indications may be performed as noted. The reflux portion of the exam is performed with the patient in reverse Trendelenburg. Significant venous reflux is defined as >500 ms in the superficial venous system, and >1 second in the deep venous system.  Venous Reflux Times +--------------+---------+------+-----------+------------+--------+ RIGHT         Reflux NoRefluxReflux TimeDiameter cmsComments                         Yes                                  +--------------+---------+------+-----------+------------+--------+  CFV           no                                             +--------------+---------+------+-----------+------------+--------+ FV prox       no                                             +--------------+---------+------+-----------+------------+--------+ FV mid        no                                             +--------------+---------+------+-----------+------------+--------+ FV dist       no                                             +--------------+---------+------+-----------+------------+--------+ Popliteal     no                                             +--------------+---------+------+-----------+------------+--------+ GSV at SFJ    no                            .77              +--------------+---------+------+-----------+------------+--------+ GSV prox thighno                            .52              +--------------+---------+------+-----------+------------+--------+ GSV mid thigh no                            .70              +--------------+---------+------+-----------+------------+--------+ GSV dist thighno                            .50              +--------------+---------+------+-----------+------------+--------+ GSV at knee   no                            .46               +--------------+---------+------+-----------+------------+--------+ GSV prox calf no                            .41              +--------------+---------+------+-----------+------------+--------+  +--------------+---------+------+-----------+------------+--------+ LEFT          Reflux NoRefluxReflux TimeDiameter cmsComments                         Yes                                  +--------------+---------+------+-----------+------------+--------+  CFV           no                                             +--------------+---------+------+-----------+------------+--------+ FV prox       no                                             +--------------+---------+------+-----------+------------+--------+ FV mid        no                                             +--------------+---------+------+-----------+------------+--------+ FV dist       no                                             +--------------+---------+------+-----------+------------+--------+ Popliteal     no                                             +--------------+---------+------+-----------+------------+--------+ GSV at SFJ    no                            .74              +--------------+---------+------+-----------+------------+--------+ GSV prox thighno                            .69              +--------------+---------+------+-----------+------------+--------+ GSV mid thigh no                            .28              +--------------+---------+------+-----------+------------+--------+ GSV dist thighno                            .58              +--------------+---------+------+-----------+------------+--------+ GSV at knee   no                            .57              +--------------+---------+------+-----------+------------+--------+ GSV prox calf           yes    866 ms       .37               +--------------+---------+------+-----------+------------+--------+   Summary: Bilateral: - No evidence of deep vein thrombosis seen in the lower extremities, bilaterally, from the common femoral through the popliteal veins. - No evidence of superficial venous thrombosis in the lower extremities, bilaterally. - No evidence of deep venous insufficiency seen bilaterally in the lower extremity. - No evidence of superficial  venous reflux seen in the short saphenous veins bilaterally.  Left: - Venous reflux is noted in the left greater saphenous vein in the calf.  *See table(s) above for measurements and observations. Electronically signed by Leotis Pain MD on 05/12/2022 at 10:07:45 AM.    Final     Assessment/Plan Essential hypertension blood pressure control important in reducing the progression of atherosclerotic disease. On appropriate oral medications.     Morbid obesity with BMI of 50.0-59.9, adult ([place]) We discussed that weight loss would be of critical importance to getting his leg swelling down.  Swelling of limb Venous reflux study showed minimal left calf great saphenous vein reflux with no other evidence of reflux.  There is no DVT or superficial thrombophlebitis.  His symptoms are better.  As his mobility increases and he does more elevation and activity, I think his leg swelling will continue to improve.  He is trying to lose weight to be able to get his knee surgery which sounds like a good goal.  I will see him back as needed.    Leotis Pain, MD  05/16/2022 11:43 AM    This note was created with Dragon medical transcription system.  Any errors from dictation are purely unintentional

## 2022-05-18 DIAGNOSIS — S90821A Blister (nonthermal), right foot, initial encounter: Secondary | ICD-10-CM | POA: Diagnosis not present

## 2022-05-18 DIAGNOSIS — L84 Corns and callosities: Secondary | ICD-10-CM | POA: Diagnosis not present

## 2022-05-29 ENCOUNTER — Encounter (INDEPENDENT_AMBULATORY_CARE_PROVIDER_SITE_OTHER): Payer: Self-pay

## 2022-05-30 ENCOUNTER — Ambulatory Visit: Payer: BC Managed Care – PPO | Admitting: Podiatry

## 2022-05-31 ENCOUNTER — Encounter: Payer: Self-pay | Admitting: Podiatry

## 2022-05-31 ENCOUNTER — Ambulatory Visit (INDEPENDENT_AMBULATORY_CARE_PROVIDER_SITE_OTHER): Payer: BC Managed Care – PPO | Admitting: Podiatry

## 2022-05-31 ENCOUNTER — Ambulatory Visit: Payer: BC Managed Care – PPO

## 2022-05-31 DIAGNOSIS — L603 Nail dystrophy: Secondary | ICD-10-CM | POA: Diagnosis not present

## 2022-05-31 DIAGNOSIS — B353 Tinea pedis: Secondary | ICD-10-CM

## 2022-05-31 DIAGNOSIS — B351 Tinea unguium: Secondary | ICD-10-CM | POA: Diagnosis not present

## 2022-05-31 DIAGNOSIS — L819 Disorder of pigmentation, unspecified: Secondary | ICD-10-CM | POA: Diagnosis not present

## 2022-05-31 DIAGNOSIS — M778 Other enthesopathies, not elsewhere classified: Secondary | ICD-10-CM

## 2022-05-31 MED ORDER — TERBINAFINE HCL 250 MG PO TABS: 250.0000 mg | ORAL_TABLET | Freq: Every day | ORAL | 0 refills | Status: DC

## 2022-05-31 NOTE — Progress Notes (Signed)
Subjective:  Patient ID: David Rangel, male    DOB: [birth year],  MRN: [id number] HPI Chief Complaint  Patient presents with   Foot Pain    Plantar foot right - dark, callused, itchy areas x 1 month, looked blistered at one time, tried tinactin cream/spray-no help   New Patient (Initial Visit)    51 y.o. male presents with the above complaint.   ROS: Denies fever chills nausea vomit muscle aches pains calf pain back pain chest pain shortness of breath.  Past Medical History:  Diagnosis Date   Asthma    Elevated PSA    Hypertension    Prostate cancer ([place]) 2019   Sleep apnea    Past Surgical History:  Procedure Laterality Date   COLONOSCOPY WITH PROPOFOL N/A 10/13/2021   Procedure: COLONOSCOPY WITH PROPOFOL;  Surgeon: Lucilla Lame, MD;  Location: [facility] ENDOSCOPY;  Service: Endoscopy;  Laterality: N/A;   GASTRIC BYPASS  2009   PROSTATE BIOPSY  2019   ?postitive    Current Outpatient Medications:    terbinafine (LAMISIL) 250 MG tablet, Take 1 tablet (250 mg total) by mouth daily., Disp: 30 tablet, Rfl: 0   albuterol (PROVENTIL HFA;VENTOLIN HFA) 108 (90 Base) MCG/ACT inhaler, Inhale 1-2 puffs into the lungs every 6 (six) hours as needed for wheezing or shortness of breath., Disp: , Rfl:    amLODipine (NORVASC) 10 MG tablet, Take 1 tablet (10 mg total) by mouth daily., Disp: 90 tablet, Rfl: 3   diclofenac (VOLTAREN) 75 MG EC tablet, Take 75 mg by mouth 2 (two) times daily., Disp: , Rfl:    hydrochlorothiazide (HYDRODIURIL) 25 MG tablet, Take 25 mg by mouth daily., Disp: , Rfl:    losartan (COZAAR) 100 MG tablet, Take 2 tablets by mouth once daily, Disp: 180 tablet, Rfl: 0   tadalafil (CIALIS) 20 MG tablet, Take 1 tablet (20 mg total) by mouth daily as needed for erectile dysfunction., Disp: 30 tablet, Rfl: 11  Allergies  Allergen Reactions   Cashew Nut Oil Anaphylaxis   Review of Systems Objective:  There were no vitals filed for this visit.  General: Well developed,  nourished, in no acute distress, alert and oriented x3   Dermatological: Skin is warm, dry and supple bilateral. Nails x 10 are well maintained; remaining integument appears unremarkable at this time. There are no open sores, no preulcerative lesions.  Plantar aspect of foot does demonstrate vesicular lesions with postinflammatory hyperpigmentation considerable pruritus with excoriations.  Also demonstrates thickening of the toenails consistent with onychomycosis.  Vascular: Dorsalis Pedis artery and Posterior Tibial artery pedal pulses are 2/4 bilateral with immedate capillary fill time. Pedal hair growth present. No varicosities and no lower extremity edema present bilateral.   Neruologic: Grossly intact via light touch bilateral. Vibratory intact via tuning fork bilateral. Protective threshold with Semmes Wienstein monofilament intact to all pedal sites bilateral. Patellar and Achilles deep tendon reflexes 2+ bilateral. No Babinski or clonus noted bilateral.   Musculoskeletal: No gross boney pedal deformities bilateral. No pain, crepitus, or limitation noted with foot and ankle range of motion bilateral. Muscular strength 5/5 in all groups tested bilateral.  Gait: Unassisted, Nonantalgic.    Radiographs: None taken  Assessment & Plan:   Assessment: Tinea pedis vesicular in nature.  Nail dystrophy probable onychomycosis  Plan: Reviewed his past medical history medications and allergies.  Start him on Lamisil 250 mg tablets 1 p.o. daily.  I also took samples of the lesions today as well as the toenails to be sent  for pathologic evaluation follow-up with him in 6 weeks     Lavell Supple T. [place], [place]

## 2022-06-08 ENCOUNTER — Encounter: Payer: Self-pay | Admitting: *Deleted

## 2022-07-12 ENCOUNTER — Encounter: Payer: Self-pay | Admitting: Podiatry

## 2022-07-12 ENCOUNTER — Ambulatory Visit (INDEPENDENT_AMBULATORY_CARE_PROVIDER_SITE_OTHER): Payer: BC Managed Care – PPO | Admitting: Podiatry

## 2022-07-12 DIAGNOSIS — Z79899 Other long term (current) drug therapy: Secondary | ICD-10-CM | POA: Diagnosis not present

## 2022-07-12 DIAGNOSIS — B353 Tinea pedis: Secondary | ICD-10-CM | POA: Diagnosis not present

## 2022-07-12 DIAGNOSIS — L603 Nail dystrophy: Secondary | ICD-10-CM | POA: Diagnosis not present

## 2022-07-12 MED ORDER — TERBINAFINE HCL 250 MG PO TABS: 250.0000 mg | ORAL_TABLET | Freq: Every day | ORAL | 0 refills | Status: DC

## 2022-07-12 NOTE — Progress Notes (Signed)
He presents today for follow-up of his nail pathology as well he has been on Lamisil for the past 30 days with no side effects which has helped with his dry scaly skin plantar aspect of the bilateral foot.  Objective: Nail pathology does demonstrate onychomycosis.  Assessment: Tinea pedis and onychomycosis.  Plan: We started him on his next 90 tablets of Lamisil today.  Also requesting complete metabolic panel.  Will follow-up with him should this be abnormal otherwise I will see him in 4 months.

## 2022-07-13 LAB — COMPREHENSIVE METABOLIC PANEL
ALT: 17 IU/L (ref 0–44)
AST: 17 IU/L (ref 0–40)
Albumin/Globulin Ratio: 1.4 (ref 1.2–2.2)
Albumin: 4.2 g/dL (ref 3.8–4.9)
Alkaline Phosphatase: 83 IU/L (ref 44–121)
BUN/Creatinine Ratio: 7 — ABNORMAL LOW (ref 9–20)
BUN: 8 mg/dL (ref 6–24)
Bilirubin Total: 0.3 mg/dL (ref 0.0–1.2)
CO2: 23 mmol/L (ref 20–29)
Calcium: 9.2 mg/dL (ref 8.7–10.2)
Chloride: 104 mmol/L (ref 96–106)
Creatinine, Ser: 1.13 mg/dL (ref 0.76–1.27)
Globulin, Total: 2.9 g/dL (ref 1.5–4.5)
Glucose: 94 mg/dL (ref 70–99)
Potassium: 5 mmol/L (ref 3.5–5.2)
Sodium: 141 mmol/L (ref 134–144)
Total Protein: 7.1 g/dL (ref 6.0–8.5)
eGFR: 78 mL/min/{1.73_m2} (ref >59)

## 2022-07-26 ENCOUNTER — Other Ambulatory Visit: Payer: Self-pay | Admitting: Internal Medicine

## 2022-07-30 IMAGING — MR MR PROSTATE WO/W CM
56 series · 56 of 56 positions shown · IV contrast (10ml Gadavist)
Comparison: None.

CLINICAL DATA: Prostate carcinoma, Gleason score 3+3=6. Rising PSA.
Active surveillance.

EXAM:
MR PROSTATE WITHOUT AND WITH CONTRAST
TECHNIQUE: Multiplanar multisequence MRI images were obtained of the pelvis
centered about the prostate. Pre and post contrast images were
obtained.
CONTRAST:  10mL GADAVIST GADOBUTROL 1 MMOL/ML IV SOLN

[Series 3: ax in&out whole · axial · 5.0mm · 0.74mm/px · 1 of 70 slices shown]
[im 1/70]
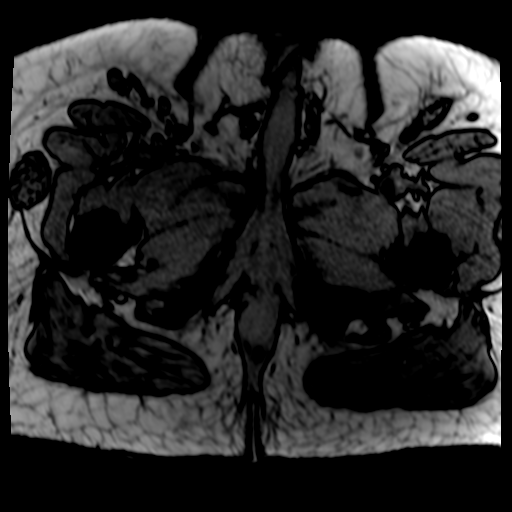

[Series 4: T2 · axial · 3.0mm · 0.56mm/px · 1 of 27 slices shown (1 of 3)]
[im 1/27]
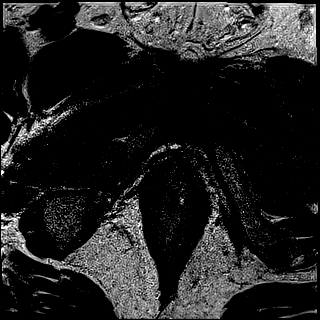

[Series 5: T2 · coronal · 3.0mm · 0.70mm/px · 1 of 35 slices shown (2 of 3)]
[im 1/35]
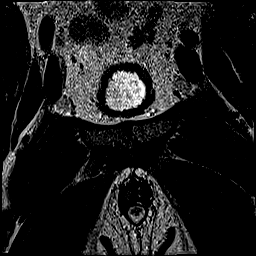

[Series 6: DWI · axial · 3.0mm · 0.86mm/px · 1 of 81 slices shown (1 of 3)]
[im 1/81]
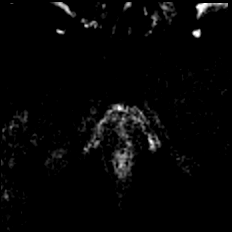

[Series 7: DWI · axial · 3.0mm · 0.86mm/px · 1 of 27 slices shown (2 of 3)]
[im 1/27]
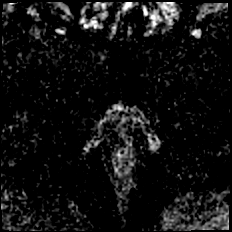

[Series 8: DWI · axial · 3.0mm · 0.86mm/px · 1 of 27 slices shown (3 of 3)]
[im 1/27]
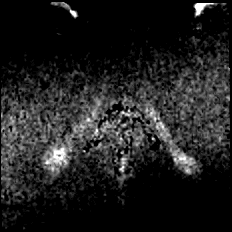

[Series 9: T2 · axial · 1.0mm · 1.04mm/px · 1 of 80 slices shown (3 of 3)]
[im 1/80]
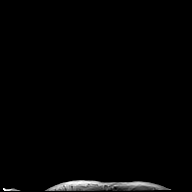

[Series 10: T1 · axial · 3.0mm · 1.15mm/px · 1 of 30 slices shown (1 of 49)]
[im 1/30]
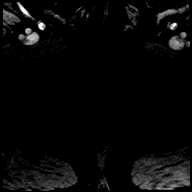

[Series 11: T1 · axial · 3.0mm · 1.15mm/px · 1 of 30 slices shown (2 of 49)]
[im 1/30]
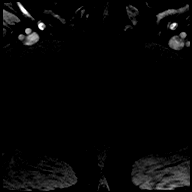

[Series 12: T1 · axial · 3.0mm · 1.15mm/px · 1 of 30 slices shown (3 of 49)]
[im 1/30]
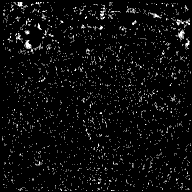

[Series 13: T1 · axial · 3.0mm · 1.15mm/px · 1 of 30 slices shown (4 of 49)]
[im 1/30]
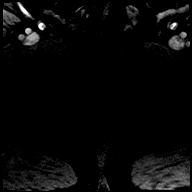

[Series 14: T1 · axial · 3.0mm · 1.15mm/px · 1 of 30 slices shown (5 of 49)]
[im 1/30]
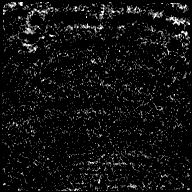

[Series 15: T1 · axial · 3.0mm · 1.15mm/px · 1 of 30 slices shown (6 of 49)]
[im 1/30]
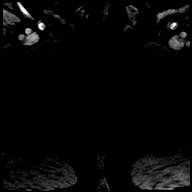

[Series 16: T1 · axial · 3.0mm · 1.15mm/px · 1 of 30 slices shown (7 of 49)]
[im 1/30]
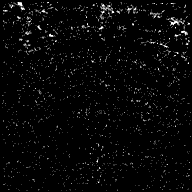

[Series 17: T1 · axial · 3.0mm · 1.15mm/px · 1 of 30 slices shown (8 of 49)]
[im 1/30]
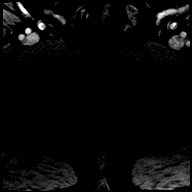

[Series 18: T1 · axial · 3.0mm · 1.15mm/px · 1 of 30 slices shown (9 of 49)]
[im 1/30]
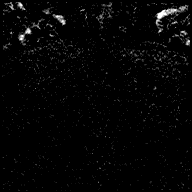

[Series 19: T1 · axial · 3.0mm · 1.15mm/px · 1 of 30 slices shown (10 of 49)]
[im 1/30]
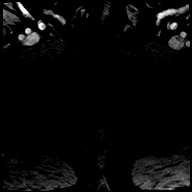

[Series 20: T1 · axial · 3.0mm · 1.15mm/px · 1 of 30 slices shown (11 of 49)]
[im 1/30]
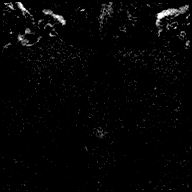

[Series 21: T1 · axial · 3.0mm · 1.15mm/px · 1 of 30 slices shown (12 of 49)]
[im 1/30]
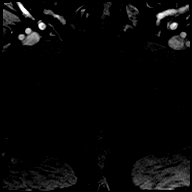

[Series 22: T1 · axial · 3.0mm · 1.15mm/px · 1 of 30 slices shown (13 of 49)]
[im 1/30]
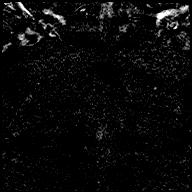

[Series 23: T1 · axial · 3.0mm · 1.15mm/px · 1 of 30 slices shown (14 of 49)]
[im 1/30]
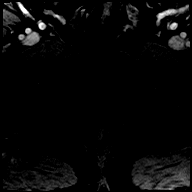

[Series 24: T1 · axial · 3.0mm · 1.15mm/px · 1 of 30 slices shown (15 of 49)]
[im 1/30]
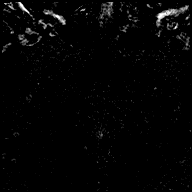

[Series 25: T1 · axial · 3.0mm · 1.15mm/px · 1 of 30 slices shown (16 of 49)]
[im 1/30]
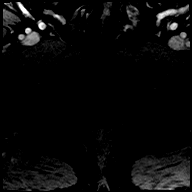

[Series 26: T1 · axial · 3.0mm · 1.15mm/px · 1 of 30 slices shown (17 of 49)]
[im 1/30]
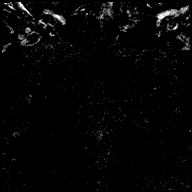

[Series 27: T1 · axial · 3.0mm · 1.15mm/px · 1 of 30 slices shown (18 of 49)]
[im 1/30]
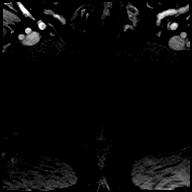

[Series 28: T1 · axial · 3.0mm · 1.15mm/px · 1 of 30 slices shown (19 of 49)]
[im 1/30]
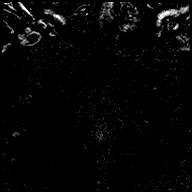

[Series 29: T1 · axial · 3.0mm · 1.15mm/px · 1 of 30 slices shown (20 of 49)]
[im 1/30]
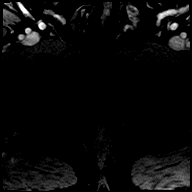

[Series 30: T1 · axial · 3.0mm · 1.15mm/px · 1 of 30 slices shown (21 of 49)]
[im 1/30]
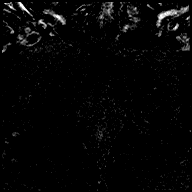

[Series 31: T1 · axial · 3.0mm · 1.15mm/px · 1 of 30 slices shown (22 of 49)]
[im 1/30]
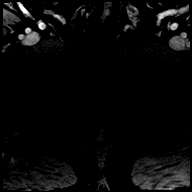

[Series 32: T1 · axial · 3.0mm · 1.15mm/px · 1 of 30 slices shown (23 of 49)]
[im 1/30]
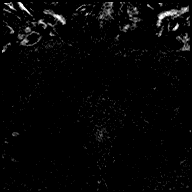

[Series 33: T1 · axial · 3.0mm · 1.15mm/px · 1 of 30 slices shown (24 of 49)]
[im 1/30]
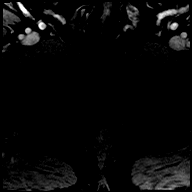

[Series 34: T1 · axial · 3.0mm · 1.15mm/px · 1 of 30 slices shown (25 of 49)]
[im 1/30]
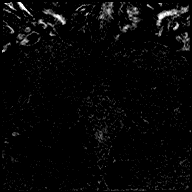

[Series 35: T1 · axial · 3.0mm · 1.15mm/px · 1 of 30 slices shown (26 of 49)]
[im 1/30]
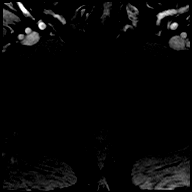

[Series 36: T1 · axial · 3.0mm · 1.15mm/px · 1 of 30 slices shown (27 of 49)]
[im 1/30]
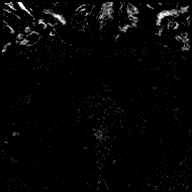

[Series 37: T1 · axial · 3.0mm · 1.15mm/px · 1 of 30 slices shown (28 of 49)]
[im 1/30]
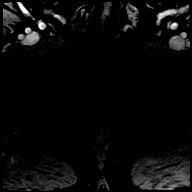

[Series 38: T1 · axial · 3.0mm · 1.15mm/px · 1 of 30 slices shown (29 of 49)]
[im 1/30]
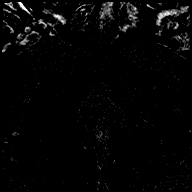

[Series 39: T1 · axial · 3.0mm · 1.15mm/px · 1 of 30 slices shown (30 of 49)]
[im 1/30]
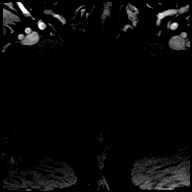

[Series 40: T1 · axial · 3.0mm · 1.15mm/px · 1 of 30 slices shown (31 of 49)]
[im 1/30]
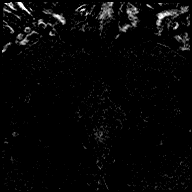

[Series 41: T1 · axial · 3.0mm · 1.15mm/px · 1 of 30 slices shown (32 of 49)]
[im 1/30]
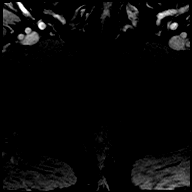

[Series 42: T1 · axial · 3.0mm · 1.15mm/px · 1 of 30 slices shown (33 of 49)]
[im 1/30]
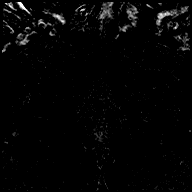

[Series 43: T1 · axial · 3.0mm · 1.15mm/px · 1 of 30 slices shown (34 of 49)]
[im 1/30]
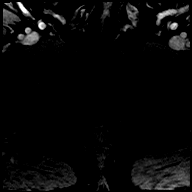

[Series 44: T1 · axial · 3.0mm · 1.15mm/px · 1 of 30 slices shown (35 of 49)]
[im 1/30]
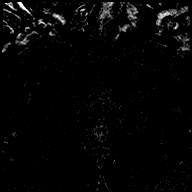

[Series 45: T1 · axial · 3.0mm · 1.15mm/px · 1 of 30 slices shown (36 of 49)]
[im 1/30]
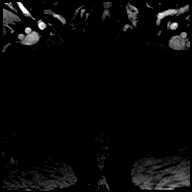

[Series 46: T1 · axial · 3.0mm · 1.15mm/px · 1 of 30 slices shown (37 of 49)]
[im 1/30]
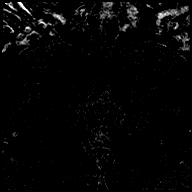

[Series 47: T1 · axial · 3.0mm · 1.15mm/px · 1 of 30 slices shown (38 of 49)]
[im 1/30]
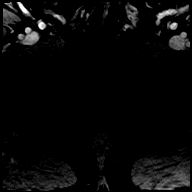

[Series 48: T1 · axial · 3.0mm · 1.15mm/px · 1 of 30 slices shown (39 of 49)]
[im 1/30]
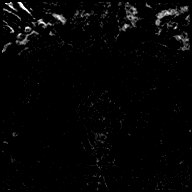

[Series 49: T1 · axial · 3.0mm · 1.15mm/px · 1 of 30 slices shown (40 of 49)]
[im 1/30]
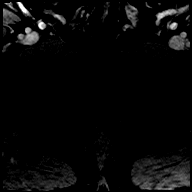

[Series 50: T1 · axial · 3.0mm · 1.15mm/px · 1 of 30 slices shown (41 of 49)]
[im 1/30]
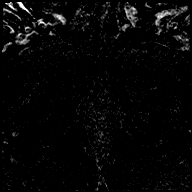

[Series 51: T1 · axial · 3.0mm · 1.15mm/px · 1 of 30 slices shown (42 of 49)]
[im 1/30]
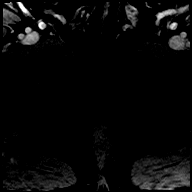

[Series 52: T1 · axial · 3.0mm · 1.15mm/px · 1 of 30 slices shown (43 of 49)]
[im 1/30]
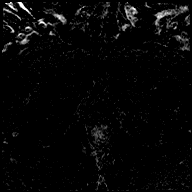

[Series 53: T1 · axial · 3.0mm · 1.15mm/px · 1 of 30 slices shown (44 of 49)]
[im 1/30]
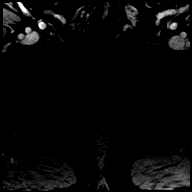

[Series 54: T1 · axial · 3.0mm · 1.15mm/px · 1 of 30 slices shown (45 of 49)]
[im 1/30]
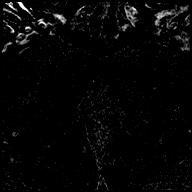

[Series 55: T1 · axial · 3.0mm · 1.15mm/px · 1 of 30 slices shown (46 of 49)]
[im 1/30]
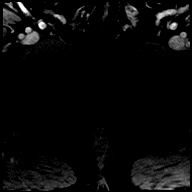

[Series 56: T1 · axial · 3.0mm · 1.15mm/px · 1 of 30 slices shown (47 of 49)]
[im 1/30]
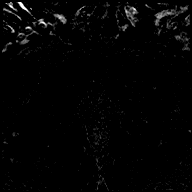

[Series 57: T1 · axial · 3.0mm · 1.15mm/px · 1 of 30 slices shown (48 of 49)]
[im 1/30]
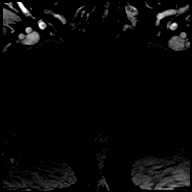

[Series 58: T1 · axial · 3.0mm · 1.15mm/px · 1 of 30 slices shown (49 of 49)]
[im 1/30]
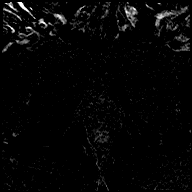

[56 of 56 positions shown; findings below may reference images not displayed]

FINDINGS: Prostate: Image degradation noted due to low signal to noise ratio.

-- Peripheral Zone: A focal nodule is seen in the left anterior base
which measures approximately 9 x 6 mm on image 13/27. This nodule
shows marked ADC hypointensity, mild DWI hyperintensity, and lack of
early focal contrast enhancement.

-- Transition/Central Zone: Focal nodule is seen in the left
anterior mid gland involving transition zone and anterior
fibromuscular stroma, which is difficult to visualized and measure
on T2 weighted sequence due to low signal to noise ratio. This is
approximately 12 x 12 mm on image [DATE], and shows marked ADC
hypointensity, mild DWI hyperintensity, and lack of focal contrast
enhancement.

A 2nd focal nodule is seen in the right anterior mid gland
transition zone which measures approximately 5 mm, and shows marked
ADC hypointensity, but lack of DWI hyperintensity and focal contrast
enhancement. Visualization of this nodule is also limited on T2
weighted sequence due to low signal to noise ratio.

-- Measurements/Volume:  3.5 x 3.9 x 4.5 cm (volume = 32 cm^3)

Transcapsular spread:  Absent

Seminal vesicle involvement:  Absent

Neurovascular bundle involvement:  Absent

Pelvic adenopathy: None visualized

Bone metastasis: None visualized

Other:  None
IMPRESSION: Image degradation due to low signal to noise ratio, particularly on
T2 weighted imaging.

Small left anterior base peripheral zone nodule, which is
indeterminate. PI-RADS 3: Intermediate (the presence of clinically
significant cancer is equivocal)

Small nodule in left anterior mid gland transition zone and
fibromuscular stroma, which is indeterminate. PI-RADS 3:
Intermediate (the presence of clinically significant cancer is
equivocal)

Small focal nodule in the right anterior mid gland transition zone,
also indeterminate. PI-RADS 3: Intermediate (the presence of
clinically significant cancer is equivocal)

No evidence of extracapsular extension or pelvic metastatic disease.

(I have processed this exam in the [employer] application for MR/US
fusion-guided biopsy if performed.)

## 2022-08-01 ENCOUNTER — Other Ambulatory Visit: Payer: Self-pay | Admitting: Internal Medicine

## 2022-08-02 ENCOUNTER — Ambulatory Visit: Payer: BC Managed Care – PPO | Admitting: Internal Medicine

## 2022-08-02 ENCOUNTER — Telehealth: Payer: Self-pay

## 2022-08-02 MED ORDER — AMLODIPINE BESYLATE 10 MG PO TABS: 10.0000 mg | ORAL_TABLET | Freq: Every day | ORAL | 0 refills | Status: AC

## 2022-08-02 MED ORDER — AZITHROMYCIN 250 MG PO TABS: ORAL_TABLET | ORAL | 0 refills | Status: AC

## 2022-08-02 NOTE — Telephone Encounter (Signed)
Patient has a cough and some chest congestion. No fever, no sore throat and no headaches. I have sent in a zpak for the patient since he has tested negative to flu and covid and has been feeling bad since last week. He will make appt if no better after zpak.

## 2022-11-15 ENCOUNTER — Ambulatory Visit (INDEPENDENT_AMBULATORY_CARE_PROVIDER_SITE_OTHER): Payer: BC Managed Care – PPO | Admitting: Podiatry

## 2022-11-15 ENCOUNTER — Encounter: Payer: Self-pay | Admitting: Podiatry

## 2022-11-15 DIAGNOSIS — L603 Nail dystrophy: Secondary | ICD-10-CM

## 2022-11-15 MED ORDER — TERBINAFINE HCL 250 MG PO TABS: 250.0000 mg | ORAL_TABLET | Freq: Every day | ORAL | 0 refills | Status: AC

## 2022-11-15 NOTE — Progress Notes (Signed)
He presents today for follow-up of his tinea pedis and his onychomycosis he states the skin is better the itching was out of control and it has completely gone away.  States that he is not too sure about his toenails but they may be looking a little lighter.  Denies any problems taking the medication.  Has no new medication prescribed to him over the past 3 months.  Objective: Vital stable oriented x 3 toenails appear to be lighter to May and skin is completely clear at this point no interdigital issues at all.  Assessment: Long-term therapy with Lamisil for onychomycosis and tinea pedis.  Plan: We are going to go ahead and refill his Lamisil he will take 30 tablets over the next 60 days and we will follow-up with him in 90 days.

## 2022-12-07 ENCOUNTER — Other Ambulatory Visit
Admission: RE | Admit: 2022-12-07 | Discharge: 2022-12-07 | Disposition: A | Discharge status: Home / Self Care | Payer: BC Managed Care – PPO | Attending: Internal Medicine | Admitting: Internal Medicine

## 2022-12-07 DIAGNOSIS — E789 Disorder of lipoprotein metabolism, unspecified: Secondary | ICD-10-CM | POA: Diagnosis not present

## 2022-12-07 DIAGNOSIS — I10 Essential (primary) hypertension: Secondary | ICD-10-CM | POA: Insufficient documentation

## 2022-12-07 DIAGNOSIS — E119 Type 2 diabetes mellitus without complications: Secondary | ICD-10-CM | POA: Diagnosis not present

## 2022-12-07 LAB — COMPREHENSIVE METABOLIC PANEL
ALT: 18 U/L (ref 0–44)
AST: 22 U/L (ref 15–41)
Albumin: 3.9 g/dL (ref 3.5–5.0)
Alkaline Phosphatase: 76 U/L (ref 38–126)
Anion gap: 7 (ref 5–15)
BUN: 10 mg/dL (ref 6–20)
CO2: 24 mmol/L (ref 22–32)
Calcium: 8.7 mg/dL — ABNORMAL LOW (ref 8.9–10.3)
Chloride: 108 mmol/L (ref 98–111)
Creatinine, Ser: 1.12 mg/dL (ref 0.61–1.24)
GFR, Estimated: >60 mL/min (ref >60)
Glucose, Bld: 105 mg/dL — ABNORMAL HIGH (ref 70–99)
Potassium: 4.1 mmol/L (ref 3.5–5.1)
Sodium: 139 mmol/L (ref 135–145)
Total Bilirubin: 0.6 mg/dL (ref 0.3–1.2)
Total Protein: 7.5 g/dL (ref 6.5–8.1)

## 2022-12-07 LAB — CBC
HCT: 41.9 % (ref 39.0–52.0)
Hemoglobin: 13.5 g/dL (ref 13.0–17.0)
MCH: 27.5 pg (ref 26.0–34.0)
MCHC: 32.2 g/dL (ref 30.0–36.0)
MCV: 85.3 fL (ref 80.0–100.0)
Platelets: 282 10*3/uL (ref 150–400)
RBC: 4.91 MIL/uL (ref 4.22–5.81)
RDW: 13.6 % (ref 11.5–15.5)
WBC: 5.4 10*3/uL (ref 4.0–10.5)
nRBC: 0 % (ref 0.0–0.2)

## 2022-12-07 LAB — LIPID PANEL
Cholesterol: 139 mg/dL (ref 0–200)
HDL: 40 mg/dL — ABNORMAL LOW (ref >40)
LDL Cholesterol: 83 mg/dL (ref 0–99)
Total CHOL/HDL Ratio: 3.5 RATIO
Triglycerides: 78 mg/dL (ref <150)
VLDL: 16 mg/dL (ref 0–40)

## 2023-02-14 ENCOUNTER — Ambulatory Visit: Payer: BC Managed Care – PPO | Admitting: Podiatry

## 2023-03-12 ENCOUNTER — Other Ambulatory Visit: Payer: Self-pay

## 2023-03-12 DIAGNOSIS — C61 Malignant neoplasm of prostate: Secondary | ICD-10-CM

## 2023-03-13 ENCOUNTER — Ambulatory Visit: Payer: BC Managed Care – PPO | Admitting: Urology

## 2023-03-13 ENCOUNTER — Other Ambulatory Visit
Admission: RE | Admit: 2023-03-13 | Discharge: 2023-03-13 | Disposition: A | Discharge status: Home / Self Care | Payer: BC Managed Care – PPO | Attending: Urology | Admitting: Urology

## 2023-03-13 ENCOUNTER — Encounter: Payer: Self-pay | Admitting: Urology

## 2023-03-13 VITALS — BP 187/82 | HR 80 | Ht 74.0 in | Wt >= 6400 oz

## 2023-03-13 DIAGNOSIS — C61 Malignant neoplasm of prostate: Secondary | ICD-10-CM | POA: Diagnosis not present

## 2023-03-13 DIAGNOSIS — N529 Male erectile dysfunction, unspecified: Secondary | ICD-10-CM

## 2023-03-13 DIAGNOSIS — R399 Unspecified symptoms and signs involving the genitourinary system: Secondary | ICD-10-CM

## 2023-03-13 LAB — PSA: Prostatic Specific Antigen: 8.64 ng/mL — ABNORMAL HIGH (ref 0.00–4.00)

## 2023-03-13 MED ORDER — TADALAFIL 20 MG PO TABS: 20.0000 mg | ORAL_TABLET | Freq: Every day | ORAL | 11 refills | Status: AC | PRN

## 2023-03-13 NOTE — Progress Notes (Signed)
   03/13/2023 3:23 PM   David Rangel [birth year] [id number]  Reason for visit: Follow up very low risk prostate cancer, ED, urinary symptoms  HPI: He is a 52 year old morbidly obese(BMI 19) African-American male with a strong family history of prostate cancer who was diagnosed with a single core of Gleason score 3+3=6 prostate cancer all the way back in 2008 by Dr. Evelene Croon.  He was then lost to follow-up.  His PSA had risen to 7.5 in 2019 and he underwent re-biopsy on 03/27/2018. This showed a single core of Gleason score 3+3=6 disease with 5% max core involvement.  He is in the low risk category.  He has a history of sepsis after prostate biopsy.   PSA bumped to 10.5 in September 2021 from 7.1 prior we have deferred DRE's with his morbid obesity and inability to palpate the prostate.  PSA 07/15/2020 remained elevated at 11.7.  He opted for a prostate MRI in January 2022 showed a 32 g prostate with no evidence of high risk prostate cancer.  There were 3 indeterminate PI-RADS 3 lesions, with the largest measuring 9 mm.  No evidence of transcapsular spread, SV involvement, neurovascular bundle involvement, or pelvic adenopathy.  Using shared decision making, he deferred fusion biopsy and opted to continue to monitor the PSA.   PSA August 2023 down to 7.4 from 10.06 March 2021, PSA today is pending.  We discussed the low, but not 0, risk of developing metastatic prostate cancer while on active surveillance, and he would like to continue active surveillance.  He has some mild ED that is well-controlled with Cialis as needed, refill provided today.  He has mild urinary symptoms of nocturia if noncompliant with CPAP, association between sleep apnea and nocturia discussed  PSA today, call with results, if <11 continue active surveillance Cialis refilled  Sondra Come, MD  [facility] [address] [place], [place] [address] [phone]
